# Patient Record
Sex: Female | Born: 1945 | Race: Black or African American | Hispanic: No | Smoking: Never smoker
Health system: Southern US, Community
[De-identification: ages and names within clinical notes are randomized; demographics above are authoritative.]

## PROBLEM LIST (undated history)

## (undated) DIAGNOSIS — I10 Essential (primary) hypertension: Secondary | ICD-10-CM

## (undated) DIAGNOSIS — C50919 Malignant neoplasm of unspecified site of unspecified female breast: Secondary | ICD-10-CM

## (undated) DIAGNOSIS — E7800 Pure hypercholesterolemia, unspecified: Secondary | ICD-10-CM

## (undated) HISTORY — DX: Malignant neoplasm of unspecified site of unspecified female breast: C50.919

---

## 2018-09-27 DIAGNOSIS — R82998 Other abnormal findings in urine: Secondary | ICD-10-CM | POA: Diagnosis not present

## 2018-09-27 DIAGNOSIS — E7849 Other hyperlipidemia: Secondary | ICD-10-CM | POA: Diagnosis not present

## 2018-09-27 DIAGNOSIS — I10 Essential (primary) hypertension: Secondary | ICD-10-CM | POA: Diagnosis not present

## 2018-10-01 DIAGNOSIS — Z8673 Personal history of transient ischemic attack (TIA), and cerebral infarction without residual deficits: Secondary | ICD-10-CM | POA: Diagnosis not present

## 2018-10-01 DIAGNOSIS — R7309 Other abnormal glucose: Secondary | ICD-10-CM | POA: Diagnosis not present

## 2018-10-01 DIAGNOSIS — Z0000 Encounter for general adult medical examination without abnormal findings: Secondary | ICD-10-CM | POA: Diagnosis not present

## 2018-10-01 DIAGNOSIS — Z6826 Body mass index (BMI) 26.0-26.9, adult: Secondary | ICD-10-CM | POA: Diagnosis not present

## 2018-10-01 DIAGNOSIS — I10 Essential (primary) hypertension: Secondary | ICD-10-CM | POA: Diagnosis not present

## 2018-10-01 DIAGNOSIS — Z86010 Personal history of colonic polyps: Secondary | ICD-10-CM | POA: Diagnosis not present

## 2018-10-01 DIAGNOSIS — Z1331 Encounter for screening for depression: Secondary | ICD-10-CM | POA: Diagnosis not present

## 2018-10-01 DIAGNOSIS — E7849 Other hyperlipidemia: Secondary | ICD-10-CM | POA: Diagnosis not present

## 2018-10-06 DIAGNOSIS — Z1212 Encounter for screening for malignant neoplasm of rectum: Secondary | ICD-10-CM | POA: Diagnosis not present

## 2019-04-27 DIAGNOSIS — E7849 Other hyperlipidemia: Secondary | ICD-10-CM | POA: Diagnosis not present

## 2019-04-27 DIAGNOSIS — I10 Essential (primary) hypertension: Secondary | ICD-10-CM | POA: Diagnosis not present

## 2019-05-12 DIAGNOSIS — E7849 Other hyperlipidemia: Secondary | ICD-10-CM | POA: Diagnosis not present

## 2019-06-10 DIAGNOSIS — I10 Essential (primary) hypertension: Secondary | ICD-10-CM | POA: Diagnosis not present

## 2019-10-13 ENCOUNTER — Other Ambulatory Visit: Payer: Self-pay | Admitting: Internal Medicine

## 2019-10-13 DIAGNOSIS — Z1231 Encounter for screening mammogram for malignant neoplasm of breast: Secondary | ICD-10-CM

## 2020-10-11 DIAGNOSIS — R7309 Other abnormal glucose: Secondary | ICD-10-CM | POA: Diagnosis not present

## 2020-10-11 DIAGNOSIS — I10 Essential (primary) hypertension: Secondary | ICD-10-CM | POA: Diagnosis not present

## 2020-10-11 DIAGNOSIS — E785 Hyperlipidemia, unspecified: Secondary | ICD-10-CM | POA: Diagnosis not present

## 2020-10-15 ENCOUNTER — Other Ambulatory Visit: Payer: Self-pay | Admitting: Internal Medicine

## 2020-10-15 DIAGNOSIS — Z1331 Encounter for screening for depression: Secondary | ICD-10-CM | POA: Diagnosis not present

## 2020-10-15 DIAGNOSIS — R7303 Prediabetes: Secondary | ICD-10-CM | POA: Diagnosis not present

## 2020-10-15 DIAGNOSIS — I10 Essential (primary) hypertension: Secondary | ICD-10-CM | POA: Diagnosis not present

## 2020-10-15 DIAGNOSIS — E785 Hyperlipidemia, unspecified: Secondary | ICD-10-CM | POA: Diagnosis not present

## 2020-10-15 DIAGNOSIS — R82998 Other abnormal findings in urine: Secondary | ICD-10-CM | POA: Diagnosis not present

## 2020-10-15 DIAGNOSIS — Z1339 Encounter for screening examination for other mental health and behavioral disorders: Secondary | ICD-10-CM | POA: Diagnosis not present

## 2020-10-15 DIAGNOSIS — Z1231 Encounter for screening mammogram for malignant neoplasm of breast: Secondary | ICD-10-CM

## 2020-10-15 DIAGNOSIS — Z0000 Encounter for general adult medical examination without abnormal findings: Secondary | ICD-10-CM | POA: Diagnosis not present

## 2020-10-31 DIAGNOSIS — Z833 Family history of diabetes mellitus: Secondary | ICD-10-CM | POA: Diagnosis not present

## 2020-10-31 DIAGNOSIS — I10 Essential (primary) hypertension: Secondary | ICD-10-CM | POA: Diagnosis not present

## 2020-10-31 DIAGNOSIS — Z8249 Family history of ischemic heart disease and other diseases of the circulatory system: Secondary | ICD-10-CM | POA: Diagnosis not present

## 2020-10-31 DIAGNOSIS — E785 Hyperlipidemia, unspecified: Secondary | ICD-10-CM | POA: Diagnosis not present

## 2020-12-06 ENCOUNTER — Ambulatory Visit
Admission: RE | Admit: 2020-12-06 | Discharge: 2020-12-06 | Disposition: A | Discharge status: Home / Self Care | Payer: Medicare Other | Source: Ambulatory Visit | Attending: Internal Medicine | Admitting: Internal Medicine

## 2020-12-06 ENCOUNTER — Other Ambulatory Visit: Payer: Self-pay

## 2020-12-06 DIAGNOSIS — Z1231 Encounter for screening mammogram for malignant neoplasm of breast: Secondary | ICD-10-CM

## 2020-12-07 ENCOUNTER — Other Ambulatory Visit: Payer: Self-pay | Admitting: Internal Medicine

## 2020-12-07 DIAGNOSIS — R928 Other abnormal and inconclusive findings on diagnostic imaging of breast: Secondary | ICD-10-CM

## 2020-12-27 ENCOUNTER — Other Ambulatory Visit: Payer: Self-pay

## 2020-12-27 ENCOUNTER — Ambulatory Visit
Admission: RE | Admit: 2020-12-27 | Discharge: 2020-12-27 | Disposition: A | Discharge status: Home / Self Care | Payer: Medicare Other | Source: Ambulatory Visit | Attending: Internal Medicine | Admitting: Internal Medicine

## 2020-12-27 ENCOUNTER — Other Ambulatory Visit: Payer: Self-pay | Admitting: Internal Medicine

## 2020-12-27 DIAGNOSIS — R928 Other abnormal and inconclusive findings on diagnostic imaging of breast: Secondary | ICD-10-CM

## 2021-01-16 ENCOUNTER — Ambulatory Visit
Admission: RE | Admit: 2021-01-16 | Discharge: 2021-01-16 | Disposition: A | Discharge status: Home / Self Care | Payer: Medicare Other | Source: Ambulatory Visit | Attending: Internal Medicine | Admitting: Internal Medicine

## 2021-01-16 ENCOUNTER — Other Ambulatory Visit: Payer: Self-pay

## 2021-01-16 ENCOUNTER — Other Ambulatory Visit: Payer: Self-pay | Admitting: Internal Medicine

## 2021-01-16 DIAGNOSIS — R928 Other abnormal and inconclusive findings on diagnostic imaging of breast: Secondary | ICD-10-CM

## 2021-01-17 ENCOUNTER — Encounter: Payer: Self-pay | Admitting: *Deleted

## 2021-01-17 DIAGNOSIS — D0511 Intraductal carcinoma in situ of right breast: Secondary | ICD-10-CM | POA: Insufficient documentation

## 2021-01-18 ENCOUNTER — Telehealth: Payer: Self-pay | Admitting: Hematology and Oncology

## 2021-01-18 NOTE — Telephone Encounter (Signed)
Spoke to patients daughter to confirm morning clinic appointment for 6/22, packet emailed

## 2021-01-22 NOTE — Progress Notes (Signed)
[facility] NOTE  Patient Care Team: Velna Hatchet, MD as PCP - General (Internal Medicine) Rockwell Germany, RN as Oncology Nurse Navigator Mauro Kaufmann, RN as Oncology Nurse Navigator Donnie Mesa, MD as Consulting Physician (General Surgery) Nicholas Lose, MD as Consulting Physician (Hematology and Oncology) Kyung Rudd, MD as Consulting Physician (Radiation Oncology)  CHIEF COMPLAINTS/PURPOSE OF CONSULTATION:  Newly diagnosed DCIS of right breast  HISTORY OF PRESENTING ILLNESS:  Victoria Huang 75 y.o. female is here because of recent diagnosis of DCIS of the right breast. Screening mammogram on 12/06/20 showed possible asymmetry and calcifications in the right breast. Diagnostic mammogram and [place] on 12/27/20 showed suspicious calcifications and associated parenchymal density in the medial aspect of the right breast and indeterminate hypoechoic mass with internal calcifications in the 4 o'clock location of the right breast 8 centimeters from the nipple. Biopsy of right breast on 01/16/21 showed DCIS, high-grade with calcifications and necrosis at 4 o'clock and DCIS, intermediate to high-grade with calcifications and necrosis in lower inner anterior, ER/PR+ (100%). She presents to the clinic today for initial evaluation and discussion of treatment options.   I reviewed her records extensively and collaborated the history with the patient.  SUMMARY OF ONCOLOGIC HISTORY: Oncology History  Ductal carcinoma in situ (DCIS) of right breast  01/17/2021 Initial Diagnosis   Screening mammogram detected right breast asymmetry and calcifications.  2 areas were detected 4 o'clock position 2.5 cm calcifications, LIQ: 9.1 cm, classifications.  Ultrasound-guided biopsy revealed high-grade DCIS with calcifications and necrosis ER 100%, PR 100%     MEDICAL HISTORY:  Past Medical History:  Diagnosis Date   Breast cancer ([place])     SURGICAL HISTORY: History reviewed.  No pertinent surgical history.  SOCIAL HISTORY: Social History   Socioeconomic History   Marital status: Unknown    Spouse name: Not on file   Number of children: Not on file   Years of education: Not on file   Highest education level: Not on file  Occupational History   Not on file  Tobacco Use   Smoking status: Never   Smokeless tobacco: Never  Substance and Sexual Activity   Alcohol use: Yes    Comment: social   Drug use: Not on file   Sexual activity: Not on file  Other Topics Concern   Not on file  Social History Narrative   Not on file   Social Determinants of Health   Financial Resource Strain: Not on file  Food Insecurity: Not on file  Transportation Needs: Not on file  Physical Activity: Not on file  Stress: Not on file  Social Connections: Not on file  Intimate Partner Violence: Not on file    FAMILY HISTORY: Family History  Problem Relation Age of Onset   Lung cancer Father    Breast cancer Sister 1   Breast cancer Daughter 21    ALLERGIES:  has No Known Allergies.  MEDICATIONS:  Current Outpatient Medications  Medication Sig Dispense Refill   amLODipine (NORVASC) 5 MG tablet Take 1 tablet by mouth at bedtime.     rosuvastatin (CRESTOR) 20 MG tablet Take 20 mg by mouth at bedtime.     No current facility-administered medications for this visit.    REVIEW OF SYSTEMS:   Constitutional: Denies fevers, chills or abnormal night sweats Eyes: Denies blurriness of vision, double vision or watery eyes Ears, nose, mouth, throat, and face: Denies mucositis or sore throat Respiratory: Denies cough, dyspnea or wheezes Cardiovascular:  Denies palpitation, chest discomfort or lower extremity swelling Gastrointestinal:  Denies nausea, heartburn or change in bowel habits Skin: Denies abnormal skin rashes Lymphatics: Denies new lymphadenopathy or easy bruising Neurological:Denies numbness, tingling or new weaknesses Behavioral/Psych: Mood is stable, no new  changes    All other systems were reviewed with the patient and are negative.  PHYSICAL EXAMINATION: ECOG PERFORMANCE STATUS: 1 - Symptomatic but completely ambulatory  Vitals:   01/23/21 0853  BP: (!) 170/78  Pulse: 72  Resp: 19  Temp: 97.8 F (36.6 C)  SpO2: 100%   Filed Weights   01/23/21 0853  Weight: 155 lb 8 oz (70.5 kg)     LABORATORY DATA:  I have reviewed the data as listed Lab Results  Component Value Date   WBC 5.7 01/23/2021   HGB 12.1 01/23/2021   HCT 37.5 01/23/2021   MCV 80.8 01/23/2021   PLT 277 01/23/2021   Lab Results  Component Value Date   NA 140 01/23/2021   K 3.9 01/23/2021   CL 109 01/23/2021   CO2 22 01/23/2021    RADIOGRAPHIC STUDIES: I have personally reviewed the radiological reports and agreed with the findings in the report.  ASSESSMENT AND PLAN:  Ductal carcinoma in situ (DCIS) of right breast Screening mammogram detected right breast asymmetry and calcifications.  2 areas were detected 4 o'clock position 2.5 cm calcifications, LIQ: 9.1 cm, classifications.  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 It is considered a precancerous lesion. DCIS is classified as a 0. It is generally detected through mammograms as calcifications. We discussed the significance of grades and its impact on prognosis. We also discussed the importance of ER and PR receptors and their implications to adjuvant treatment options. Prognosis of DCIS dependence on grade, comedo necrosis. It is anticipated that if not treated, 20-30% of DCIS can develop into invasive breast cancer.  Recommendation: 1.  Mastectomy 2. Followed by antiestrogen therapy with tamoxifen 5 years If she does bilateral mastectomies then she will not need antiestrogen therapy. Genetics consultation will be obtained  Tamoxifen  counseling: We discussed the risks and benefits of tamoxifen. These include but not limited to insomnia, hot flashes, mood changes, vaginal dryness, and weight gain. Although rare, serious side effects including endometrial cancer, risk of blood clots were also discussed. We strongly believe that the benefits far outweigh the risks. Patient understands these risks and consented to starting treatment. Planned treatment duration is 5 years.  She currently works part-time as a Social worker. Her daughter is a 5-year breast cancer survivor and appears to be helping her make these decisions. Return to clinic after surgery to discuss the final pathology report and come up with an adjuvant treatment plan.    All questions were answered. The patient knows to call the clinic with any problems, questions or concerns.   Rulon Eisenmenger, MD, MPH 01/23/2021    I, Thana Ates, am acting as scribe for Nicholas Lose, MD.  I have reviewed the above documentation for accuracy and completeness, and I agree with the above.

## 2021-01-23 ENCOUNTER — Ambulatory Visit (HOSPITAL_BASED_OUTPATIENT_CLINIC_OR_DEPARTMENT_OTHER): Payer: Medicare Other | Admitting: Genetic Counselor

## 2021-01-23 ENCOUNTER — Ambulatory Visit: Payer: Self-pay | Admitting: Surgery

## 2021-01-23 ENCOUNTER — Inpatient Hospital Stay: Payer: Medicare Other

## 2021-01-23 ENCOUNTER — Ambulatory Visit: Payer: Medicare Other | Attending: Surgery | Admitting: Physical Therapy

## 2021-01-23 ENCOUNTER — Encounter: Payer: Self-pay | Admitting: Physical Therapy

## 2021-01-23 ENCOUNTER — Encounter: Payer: Self-pay | Admitting: Genetic Counselor

## 2021-01-23 ENCOUNTER — Ambulatory Visit
Admission: RE | Admit: 2021-01-23 | Discharge: 2021-01-23 | Disposition: A | Discharge status: Home / Self Care | Payer: Medicare Other | Source: Ambulatory Visit | Attending: Radiation Oncology | Admitting: Radiation Oncology

## 2021-01-23 ENCOUNTER — Other Ambulatory Visit: Payer: Self-pay

## 2021-01-23 ENCOUNTER — Encounter: Payer: Self-pay | Admitting: General Practice

## 2021-01-23 ENCOUNTER — Inpatient Hospital Stay: Payer: Medicare Other | Attending: Hematology and Oncology | Admitting: Hematology and Oncology

## 2021-01-23 ENCOUNTER — Encounter: Payer: Self-pay | Admitting: Hematology and Oncology

## 2021-01-23 DIAGNOSIS — Z801 Family history of malignant neoplasm of trachea, bronchus and lung: Secondary | ICD-10-CM | POA: Diagnosis not present

## 2021-01-23 DIAGNOSIS — D0511 Intraductal carcinoma in situ of right breast: Secondary | ICD-10-CM | POA: Diagnosis present

## 2021-01-23 DIAGNOSIS — Z803 Family history of malignant neoplasm of breast: Secondary | ICD-10-CM | POA: Diagnosis not present

## 2021-01-23 DIAGNOSIS — Z170 Estrogen receptor positive status [ER+]: Secondary | ICD-10-CM | POA: Insufficient documentation

## 2021-01-23 DIAGNOSIS — R293 Abnormal posture: Secondary | ICD-10-CM | POA: Insufficient documentation

## 2021-01-23 DIAGNOSIS — C50311 Malignant neoplasm of lower-inner quadrant of right female breast: Secondary | ICD-10-CM | POA: Diagnosis present

## 2021-01-23 DIAGNOSIS — Z8042 Family history of malignant neoplasm of prostate: Secondary | ICD-10-CM

## 2021-01-23 HISTORY — DX: Family history of malignant neoplasm of breast: Z80.3

## 2021-01-23 HISTORY — DX: Family history of malignant neoplasm of prostate: Z80.42

## 2021-01-23 LAB — CMP (CANCER CENTER ONLY)
ALT: 12 U/L (ref 0–44)
AST: 17 U/L (ref 15–41)
Albumin: 3.7 g/dL (ref 3.5–5.0)
Alkaline Phosphatase: 79 U/L (ref 38–126)
Anion gap: 9 (ref 5–15)
BUN: 12 mg/dL (ref 8–23)
CO2: 22 mmol/L (ref 22–32)
Calcium: 9.5 mg/dL (ref 8.9–10.3)
Chloride: 109 mmol/L (ref 98–111)
Creatinine: 0.72 mg/dL (ref 0.44–1.00)
GFR, Estimated: >60 mL/min (ref >60)
Glucose, Bld: 138 mg/dL — ABNORMAL HIGH (ref 70–99)
Potassium: 3.9 mmol/L (ref 3.5–5.1)
Sodium: 140 mmol/L (ref 135–145)
Total Bilirubin: 0.4 mg/dL (ref 0.3–1.2)
Total Protein: 7.1 g/dL (ref 6.5–8.1)

## 2021-01-23 LAB — CBC WITH DIFFERENTIAL (CANCER CENTER ONLY)
Abs Immature Granulocytes: 0.02 10*3/uL (ref 0.00–0.07)
Basophils Absolute: 0.1 10*3/uL (ref 0.0–0.1)
Basophils Relative: 1 %
Eosinophils Absolute: 0.1 10*3/uL (ref 0.0–0.5)
Eosinophils Relative: 2 %
HCT: 37.5 % (ref 36.0–46.0)
Hemoglobin: 12.1 g/dL (ref 12.0–15.0)
Immature Granulocytes: 0 %
Lymphocytes Relative: 33 %
Lymphs Abs: 1.9 10*3/uL (ref 0.7–4.0)
MCH: 26.1 pg (ref 26.0–34.0)
MCHC: 32.3 g/dL (ref 30.0–36.0)
MCV: 80.8 fL (ref 80.0–100.0)
Monocytes Absolute: 0.4 10*3/uL (ref 0.1–1.0)
Monocytes Relative: 8 %
Neutro Abs: 3.2 10*3/uL (ref 1.7–7.7)
Neutrophils Relative %: 56 %
Platelet Count: 277 10*3/uL (ref 150–400)
RBC: 4.64 MIL/uL (ref 3.87–5.11)
RDW: 14.6 % (ref 11.5–15.5)
WBC Count: 5.7 10*3/uL (ref 4.0–10.5)
nRBC: 0 % (ref 0.0–0.2)

## 2021-01-23 LAB — GENETIC SCREENING ORDER

## 2021-01-23 NOTE — Therapy (Signed)
[place], [place], [address] Phone: [phone]   Fax:  [phone]  Physical Therapy Evaluation  Patient Details  Name: Victoria Huang MRN: [id number] Date of Birth: [birth year] Referring Provider (PT): Dr. Donnie Mesa   Encounter Date: 01/23/2021   PT End of Session - 01/23/21 1246     Visit Number 1    Number of Visits 2    Date for PT Re-Evaluation 03/20/21    PT Start Time 0903    PT Stop Time 0940    PT Time Calculation (min) 37 min    Activity Tolerance Patient tolerated treatment well    Behavior During Therapy [facility] for tasks assessed/performed             Past Medical History:  Diagnosis Date   Breast cancer [facility])    Family history of breast cancer 01/23/2021   Family history of prostate cancer 01/23/2021    History reviewed. No pertinent surgical history.  There were no vitals filed for this visit.    Subjective Assessment - 01/23/21 1235     Subjective Patient reports she is here today to be seen by her medical team for her newly diagnosed right breast cancer.    Patient is accompained by: Family member    Pertinent History Patient was diagnosed on 12/06/2020 with right high grade DCIS breast cancer. There are 2 areas of calcifications measuring 9.1 cm and 2.5 cm located in the lower inner quadrant. It is ER/PR positive.    Patient Stated Goals Reduce lymphedema risk and learn post op shoulder ROM HEP    Currently in Pain? No/denies                [facility] PT Assessment - 01/23/21 0001       Assessment   Medical Diagnosis Right breast cancer    Referring Provider (PT) Dr. Donnie Mesa    Onset Date/Surgical Date 12/06/20    Hand Dominance Right    Prior Therapy none      Precautions   Precautions Other (comment)    Precaution Comments active cancer      Restrictions   Weight Bearing Restrictions No      Balance Screen   Has the patient fallen in the past 6  months No    Has the patient had a decrease in activity level because of a fear of falling?  No    Is the patient reluctant to leave their home because of a fear of falling?  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    Leisure She swims in the summer      Cognition   Overall Cognitive Status Within Functional Limits for tasks assessed      Posture/Postural Control   Posture/Postural Control Postural limitations    Postural Limitations Rounded Shoulders;Forward head      ROM / Strength   AROM / PROM / Strength AROM;Strength      AROM   Overall AROM Comments Cervical extension limited 25%; others are WNL    AROM Assessment Site Shoulder    Right/Left Shoulder Right;Left    Right Shoulder Extension 45 Degrees    Right Shoulder Flexion 144  Degrees    Right Shoulder ABduction 150 Degrees    Right Shoulder Internal Rotation 69 Degrees    Right Shoulder External Rotation 68 Degrees    Left Shoulder Extension 52 Degrees    Left Shoulder Flexion 136 Degrees    Left Shoulder ABduction 134 Degrees    Left Shoulder Internal Rotation 63 Degrees    Left Shoulder External Rotation 66 Degrees      Strength   Overall Strength Within functional limits for tasks performed               LYMPHEDEMA/ONCOLOGY QUESTIONNAIRE - 01/23/21 0001       Type   Cancer Type Right breast cancer      Lymphedema Assessments   Lymphedema Assessments Upper extremities      Right Upper Extremity Lymphedema   10 cm Proximal to Olecranon Process 29.4 cm    Olecranon Process 25.5 cm    10 cm Proximal to Ulnar Styloid Process 22.5 cm    Just Proximal to Ulnar Styloid Process 16.1 cm    Across Hand at [employer] 20 cm    At [place] of 2nd Digit 6.6 cm      Left Upper  Extremity Lymphedema   10 cm Proximal to Olecranon Process 29.4 cm    Olecranon Process 25.9 cm    10 cm Proximal to Ulnar Styloid Process 21.6 cm    Just Proximal to Ulnar Styloid Process 16.1 cm    Across Hand at [employer] 20.2 cm    At [place] of 2nd Digit 6.4 cm             L-DEX FLOWSHEETS - 01/23/21 1200       L-DEX LYMPHEDEMA SCREENING   Measurement Type Unilateral    L-DEX MEASUREMENT EXTREMITY Upper Extremity    POSITION  Standing    DOMINANT SIDE Right    At Risk Side Right    BASELINE SCORE (UNILATERAL) -2             The patient was assessed using the L-Dex machine today to produce a lymphedema index baseline score. The patient will be reassessed on a regular basis (typically every 3 months) to obtain new L-Dex scores. If the score is > 6.5 points away from his/her baseline score indicating onset of subclinical lymphedema, it will be recommended to wear a compression garment for 4 weeks, 12 hours per day and then be reassessed. If the score continues to be > 6.5 points from baseline at reassessment, we will initiate lymphedema treatment. Assessing in this manner has a 95% rate of preventing clinically significant lymphedema.      Katina Dung - 01/23/21 0001     Open a tight or new jar No difficulty    Do heavy household chores (wash walls, wash floors) No difficulty    Carry a shopping bag or briefcase No difficulty    Wash your back No difficulty    Use a knife to cut food No difficulty    Recreational activities in which you take some force or impact through your arm, shoulder, or hand (golf, hammering, tennis) Moderate difficulty    During the past week, to what extent has your arm, shoulder or hand problem interfered with your normal social activities with family, friends, neighbors, or groups? Slightly    During the past week, to what extent has your arm, shoulder or hand problem limited your work or other regular daily activities Extremely    Arm,  shoulder, or hand pain. Moderate    Tingling (pins and needles) in your arm, shoulder, or hand Moderate    Difficulty Sleeping Mild difficulty    DASH Score 27.27 %              Objective measurements completed on examination: See above findings.         Patient was instructed today in a home exercise program today for post op shoulder range of motion. These included active assist shoulder flexion in sitting, scapular retraction, wall walking with shoulder abduction, and hands behind head external rotation.  She was encouraged to do these twice a day, holding 3 seconds and repeating 5 times when permitted by her physician.        PT Education - 01/23/21 1245     Education Details Lymphedema risk reduction and post op shoulder ROM HEP    Person(s) Educated Patient;Child(ren)    Methods Explanation;Demonstration;Handout    Comprehension Verbalized understanding;Returned demonstration                 PT Long Term Goals - 01/23/21 1249       PT LONG TERM GOAL #1   Title Patient will demonstrate she has regained full shoulder ROM and function post operatively compared to baselines.    Time 8    Period Weeks    Status New    Target Date 03/20/21             Breast Clinic Goals - 01/23/21 1249       Patient will be able to verbalize understanding of pertinent lymphedema risk reduction practices relevant to her diagnosis specifically related to skin care.   Time 1    Period Days    Status Achieved      Patient will be able to return demonstrate and/or verbalize understanding of the post-op home exercise program related to regaining shoulder range of motion.   Time 1    Period Days    Status Achieved      Patient will be able to verbalize understanding of the importance of attending the postoperative After Breast Cancer Class for further lymphedema risk reduction education and therapeutic exercise.   Time 1    Period Days    Status Achieved                    Plan - 01/23/21 1247     Clinical Impression Statement Patient was diagnosed on 12/06/2020 with right high grade DCIS breast cancer. There are 2 areas of calcifications measuring 9.1 cm and 2.5 cm located in the lower inner quadrant. It is ER/PR positive. Her multidisciplinary medical team met prior to her assessments to determine a recommended treatment plan. She is planning to have a right mastectomy and sentinel node biopsy followed by anti-estrogen therapy. She will benefit from a post op PT reassessment to determine needs and from L-Dex screens every 3 months for 2 years to detect subclinical lymphedema.    Stability/Clinical Decision Making Stable/Uncomplicated    Clinical Decision Making Low    Rehab Potential Excellent    PT Frequency --   Eval and 1 f/u visit   PT Treatment/Interventions ADLs/Self Care Home Management;Therapeutic exercise;Patient/family education    PT Next Visit Plan Will reassess 3-4 weeks post op    PT Home Exercise Plan Post op shoulder ROM HEP    Consulted and Agree with Plan of Care Patient;Family member/caregiver    Family Member Consulted Daughter  Patient will benefit from skilled therapeutic intervention in order to improve the following deficits and impairments:  Decreased knowledge of precautions, Postural dysfunction, Decreased range of motion, Impaired UE functional use, Pain  Visit Diagnosis: Malignant neoplasm of lower-inner quadrant of right 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, estrogen receptor positive ([place]) - Plan: PT plan of care cert/re-cert  Abnormal posture - Plan: PT plan of care cert/re-cert  Patient will follow up at outpatient cancer rehab 3-4 weeks following surgery.  If the patient requires physical therapy at that time, a specific plan will be dictated and sent to the referring physician for approval. The patient was educated today on appropriate basic range of motion exercises to begin post operatively and the importance  of attending the After Breast Cancer class following surgery.  Patient was educated today on lymphedema risk reduction practices as it pertains to recommendations that will benefit the patient immediately following surgery.  She verbalized good understanding.      Problem List Patient Active Problem List   Diagnosis Date Noted   Family history of breast cancer 01/23/2021   Family history of prostate cancer 01/23/2021   Ductal carcinoma in situ (DCIS) of right breast 01/17/2021   Annia Friendly, PT 01/23/21 1:56 PM   [place], [place], [address] Phone: [phone]   Fax:  [phone]  Name: ANNALEIA PENCE MRN: [id number] Date of Birth: 03-Jul-1946

## 2021-01-23 NOTE — H&P (View-Only) (Signed)
History of Present Illness Victoria Huang. Victoria Tallarico MD; 01/23/2021 12:19 PM) The patient is a 75 year old female who presents with breast cancer. [place] 01/23/21 Victoria Huang/ Victoria Huang  This is a 75 year old female who presents after a recent screening mammogram revealed right breast asymmetry and calcifications.  She was found to have right lower inner quadrant calcifications spanning 9.1 x 2.9 x 1.9 centimeters.  She has at least three other areas of concern, the largest in the right lower inner quadrant measuring 2.5 x 0.8 x 0.4 cm.  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  ER/PR positive.    She is accompanied by her daughter, who had breast cancer in her early 75's and underwent bilateral mastectomies with DIEP flap reconstruction.    She presents today for treatment discussion.  CLINICAL DATA:  Patient returns after new baseline study for evaluation of possible RIGHT breast asymmetry and RIGHT breast calcifications.  EXAM: DIGITAL DIAGNOSTIC UNILATERAL RIGHT MAMMOGRAM WITH TOMOSYNTHESIS AND CAD; ULTRASOUND RIGHT BREAST LIMITED  TECHNIQUE: Right digital diagnostic mammography and breast tomosynthesis was performed. The images were evaluated with computer-aided detection.; Targeted ultrasound examination of the right breast was performed  COMPARISON:  12/06/2020  ACR Breast Density Category b: There are scattered areas of fibroglandular density.  FINDINGS: Spot compression views are performed of the LATERAL portion of the RIGHT breast, confirming presence of asymmetry/possible mass in the UPPER-OUTER QUADRANT.  Magnification views are performed of calcifications in the LOWER INNER QUADRANT of the RIGHT breast. These views demonstrate extensive faint linear calcifications spanning 9.1 x 2.9 x 1.9 centimeters. Along the most MEDIAL aspect of the calcifications, there is associated parenchymal density.  A small mass associated with calcifications is  identified in the LOWER portion of the RIGHT breast.  On physical exam, I palpate no abnormality in the MEDIAL aspect of the RIGHT breast.  Targeted ultrasound is performed, showing numerous hypoechoic parallel oval masses in the UPPER-OUTER QUADRANT of the RIGHT breast, largest measuring 0.9 centimeters. No solid mass or areas of acoustic shadowing identified in the LATERAL aspect of the RIGHT breast.  In the 4 o'clock location of the RIGHT breast 8 centimeters from the nipple, there is an oval circumscribed hypoechoic mass containing calcifications and associated posterior acoustic shadowing. This mass measures 0.5 x 0.3 x 0.6 centimeters.  In the 4 o'clock location of the RIGHT breast 10 centimeters from the nipple, there is tubular shaped hypoechoic tissue containing calcifications and spanning 2.5 x 0.8 x 0.4 centimeters. These calcifications are best appreciated at real-time imaging.  Evaluation of the RIGHT axilla is negative for adenopathy.  IMPRESSION: 1. Suspicious calcifications and associated parenchymal density in the MEDIAL aspect of the RIGHT breast warranting tissue diagnosis. 2. No suspicious abnormality in the UPPER-OUTER QUADRANT of the RIGHT breast. 3. Indeterminate hypoechoic mass with internal calcifications in the 4 o'clock location of the RIGHT breast 8 centimeters from the nipple.  RECOMMENDATION: 1. Recommend ultrasound-guided core biopsy of the more MEDIAL component of the calcifications in the 4 o'clock location 10 centimeters from the nipple. 2. Recommend stereotactic guided core biopsy of the more anterior calcifications in the MEDIAL aspect of the RIGHT breast. 3. Pending review of pathology results and clip placement, biopsy of the mass the 4 o'clock location 8 centimeters from the nipple may not be necessary. As needed, biopsy of this lesion can be performed.  I have discussed the findings and recommendations with the patient. If  applicable, a reminder letter will  be sent to the patient regarding the next appointment.  BI-RADS CATEGORY  5: Highly suggestive of malignancy.   Electronically Signed  By: Nolon Nations M.D.  On: 12/27/2020 14:31  CLINICAL DATA:  75 year old female presenting for ultrasound-guided biopsy of a mass with calcifications in the medial right breast  EXAM: ULTRASOUND GUIDED RIGHT BREAST CORE NEEDLE BIOPSY  COMPARISON:  Previous exam(s).  PROCEDURE: I met with the patient and we discussed the procedure of ultrasound-guided biopsy, including benefits and alternatives. We discussed the high likelihood of a successful procedure. We discussed the risks of the procedure, including infection, bleeding, tissue injury, clip migration, and inadequate sampling. Informed written consent was given. The usual time-out protocol was performed immediately prior to the procedure.  Lesion quadrant: Lower inner quadrant  Using sterile technique and 1% Lidocaine as local anesthetic, under direct ultrasound visualization, a 14 gauge spring-loaded device was used to perform biopsy of a mass with calcifications in the right breast at 4 o'clock, 10 cm from the nipple using an inferomedial approach. At the conclusion of the procedure a ribbon shaped tissue marker clip was deployed into the biopsy cavity. Follow up 2 view mammogram was performed and dictated separately.  IMPRESSION: Ultrasound guided biopsy of a mass with calcifications in the right breast at 4 o'clock, 10 cm from the nipple. No apparent complications.  Electronically Signed: By: Ammie Ferrier M.D. On: 01/16/2021 08:20  CLINICAL DATA:  75 year old female presenting for stereotactic biopsy of calcifications anterior breast  EXAM: RIGHT BREAST STEREOTACTIC CORE NEEDLE BIOPSY  COMPARISON:  Previous exams.  FINDINGS: The patient and I discussed the procedure of stereotactic-guided biopsy including benefits and  alternatives. We discussed the high likelihood of a successful procedure. We discussed the risks of the procedure including infection, bleeding, tissue injury, clip migration, and inadequate sampling. Informed written consent was given. The usual time out protocol was performed immediately prior to the procedure.  Using sterile technique and 1% Lidocaine as local anesthetic, under stereotactic guidance, a 9 gauge vacuum assisted device was used to perform core needle biopsy of calcifications in the lower-inner quadrant of the right breast using a medial approach. Specimen radiograph was performed showing calcifications in multiple core samples. Specimens with calcifications are identified for pathology.  Lesion quadrant: Lower inner quadrant  At the conclusion of the procedure, and X shaped tissue marker clip was deployed into the biopsy cavity. Follow-up 2-view mammogram was performed and dictated separately.  IMPRESSION: Stereotactic-guided biopsy of calcifications in the lower inner anterior right breast. No apparent complications.  Electronically Signed: By: Ammie Ferrier M.D. On: 01/16/2021 10:23   Past Surgical History Conni Slipper, RN; 01/23/2021 8:01 AM) Breast Biopsy  Right. Oral Surgery   Diagnostic Studies History Conni Slipper, RN; 01/23/2021 8:01 AM) Colonoscopy  1-5 years ago Mammogram  1-3 years ago Pap Smear  1-5 years ago  Allergies Rodman Key K. Malarie Tappen, MD; 01/23/2021 12:19 PM) No Known Allergies  [01/21/2021]:  Medication History Victoria Huang. Amyr Sluder, MD; 01/23/2021 12:19 PM) Medications Reconciled  amLODIPine Besylate  (5MG Tablet, Oral) Active. Aspirin  (81MG Tablet DR, Oral) Active. Rosuvastatin Calcium  (20MG Tablet, Oral) Active.  Social History Conni Slipper, RN; 01/23/2021 8:01 AM) Alcohol use  Occasional alcohol use. Caffeine use  Coffee. No drug use  Tobacco use  Never smoker.  Family History Conni Slipper, RN; 01/23/2021 8:01 AM) Breast Cancer   Sister. Diabetes Mellitus  Sister. Kidney Disease  Sister. Prostate Cancer  Brother.  Pregnancy / Birth History Conni Slipper, RN;  01/23/2021 8:01 AM) Age at menarche  53 years. Age of menopause  <45 Contraceptive History  Oral contraceptives. Maternal age  6-25 Para  2 Regular periods   Other Problems Conni Slipper, RN; 01/23/2021 8:01 AM) High blood pressure      Review of Systems Conni Slipper RN; 01/23/2021 8:01 AM) General Not Present- Appetite Loss, Chills, Fatigue, Fever, Night Sweats, Weight Gain and Weight Loss. Skin Not Present- Change in Wart/Mole, Dryness, Hives, Jaundice, New Lesions, Non-Healing Wounds, Rash and Ulcer. HEENT Not Present- Earache, Hearing Loss, Hoarseness, Nose Bleed, Oral Ulcers, Ringing in the Ears, Seasonal Allergies, Sinus Pain, Sore Throat, Visual Disturbances, Wears glasses/contact lenses and Yellow Eyes. Respiratory Not Present- Bloody sputum, Chronic Cough, Difficulty Breathing, Snoring and Wheezing. Breast Not Present- Breast Mass, Breast Pain, Nipple Discharge and Skin Changes. Cardiovascular Not Present- Chest Pain, Difficulty Breathing Lying Down, Leg Cramps, Palpitations, Rapid Heart Rate, Shortness of Breath and Swelling of Extremities. Gastrointestinal Not Present- Abdominal Pain, Bloating, Bloody Stool, Change in Bowel Habits, Chronic diarrhea, Constipation, Difficulty Swallowing, Excessive gas, Gets full quickly at meals, Hemorrhoids, Indigestion, Nausea, Rectal Pain and Vomiting. Female Genitourinary Not Present- Frequency, Nocturia, Painful Urination, Pelvic Pain and Urgency. Musculoskeletal Not Present- Back Pain, Joint Pain, Joint Stiffness, Muscle Pain, Muscle Weakness and Swelling of Extremities. Neurological Present- Tingling. Not Present- Decreased Memory, Fainting, Headaches, Numbness, Seizures, Tremor, Trouble walking and Weakness. Psychiatric Not Present- Anxiety, Bipolar, Change in Sleep Pattern, Depression, Fearful and Frequent  crying. Endocrine Not Present- Cold Intolerance, Excessive Hunger, Hair Changes, Heat Intolerance, Hot flashes and New Diabetes. Hematology Not Present- Blood Thinners, Easy Bruising, Excessive bleeding, Gland problems, HIV and Persistent Infections.   Physical Exam Rodman Key K. Normalee Sistare MD; 01/23/2021 12:19 PM)  The physical exam findings are as follows: Note: Constitutional: WDWN in NAD, conversant, no obvious deformities; resting comfortably Eyes: Pupils equal, round; sclera anicteric; moist conjunctiva; no lid lag HENT: Oral mucosa moist; good dentition Neck: No masses palpated, trachea midline; no thyromegaly Lungs: CTA bilaterally; normal respiratory effort Breasts: symmetric; slight ecchymosis from biopsy; no palpable masses or lymphadenopathy; no nipple changes CV: Regular rate and rhythm; no murmurs; extremities well-perfused with no edema Abd: +bowel sounds, soft, non-tender, no palpable organomegaly; no palpable hernias Musc: Normal gait; no apparent clubbing or cyanosis in extremities Lymphatic: No palpable cervical or axillary lymphadenopathy Skin: Warm, dry; no sign of jaundice Psychiatric - alert and oriented x 4; calm mood and affect    Assessment & Plan Rodman Key K. Zavannah Deblois MD; 01/23/2021 12:22 PM)  DUCTAL CARCINOMA IN SITU (DCIS) OF RIGHT BREAST (D05.11)  Current Plans Schedule for Surgery - Right mastectomy with sentinel lymph node biopsy.  The surgical procedure has been discussed with the patient.  Potential risks, benefits, alternative treatments, and expected outcomes have been explained.  All of the patient's questions at this time have been answered.  The likelihood of reaching the patient's treatment goal is good.  The patient understand the proposed surgical procedure and wishes to proceed. Note: Due to the large area of DCIS and multifocal disease, we recommend right mastectomy with sentinel lymph node biopsy, followed by anti-estrogens.  We will perform genetic  testing prior to surgery.  There is no indication to pursue MRI and other right breast biopsies since she will have mastectomy.  Depending on genetic testing, she may want to have a left prophylactic mastectomy.    We will go ahead and post her right mastectomy with SLNB, but can adjust to add on a left mastectomy if needed.  She does not want to consider reconstruction.  Victoria Huang. Georgette Dover, MD, [facility]  General/ Trauma Surgery   01/23/2021 12:23 PM

## 2021-01-23 NOTE — Progress Notes (Signed)
[place] Psychosocial Distress Screening Clinical Social Work  Clinical Social Work was referred by distress screening protocol.  The patient scored a 7 on the Psychosocial Distress Thermometer which indicates moderate distress. Clinical Social Worker met with patient in exam room"", she was accompanied by her adult daughter who is a breast cancer survivor and a good support for her  to assess for distress and other psychosocial needs. She is normally active and independent, lives alone after having moved here from [place] approx 2 years ago. She currently works part time, although daughter says she often works more than 40 hours/week.  She wonders if she needs to complete FMLA paperwork as she is part time.  CSW encouraged her to speak w her HR department and determine her next steps. She is understandably anxious about upcoming treatments.  Per daughter, she may have difficulty asking for help and being dependent on others as she is used to being on her own.  Daughter lives nearby and is willing to help as needed.  CSW and patient discussed common feeling and emotions when being diagnosed with cancer, and the importance of support during treatment.  CSW informed patient of the support team and support services at [facility].  CSW provided contact information and encouraged patient to call with any questions or concerns.   ONCBCN DISTRESS SCREENING 01/23/2021  Screening Type Initial Screening  Distress experienced in past week (1-10) 7  Practical problem type Work/school  Emotional problem type Adjusting to illness  Spiritual/Religous concerns type Facing my mortality  Information Concerns Type Lack of info about diagnosis;Lack of info about treatment;Lack of info about complementary therapy choices  Physical Problem type Changes in urination;Tingling hands/feet  Other [phone]    Clinical Social Worker follow up needed: No.  If yes, follow up plan:  Beverely Pace, [place],  LCSW Clinical Social Worker Phone:  [phone]

## 2021-01-23 NOTE — Progress Notes (Signed)
REFERRING PROVIDER: Nicholas Lose, MD [place],  [place] [address]  PRIMARY PROVIDER:  Velna Hatchet, MD  PRIMARY REASON FOR VISIT:  Encounter Diagnoses  Name Primary?   Ductal carcinoma in situ (DCIS) of right breast Yes   Family history of breast cancer    Family history of prostate cancer      HISTORY OF PRESENT ILLNESS:   Victoria Huang, a 75 y.o. female, was seen for a [facility] cancer genetics consultation at the request of Dr. Lindi Adie due to a personal and family history of cancer.  Victoria Huang presents to clinic today to discuss the possibility of a hereditary predisposition to cancer, to discuss genetic testing, and to further clarify her future cancer risks, as well as potential cancer risks for family members.   In June 2022, at the age of 65, Victoria Huang was diagnosed with ductal carcinoma in situ of the right breast (ER+/PR+). The preliminary treatment plan includes right mastectomy and anti-estrogens.   CANCER HISTORY:  Oncology History  Ductal carcinoma in situ (DCIS) of right breast  01/17/2021 Initial Diagnosis   Ductal carcinoma in situ (DCIS) of right breast  Screening mammogram on 12/06/20 showed possible asymmetry and calcifications in the right breast. Diagnostic mammogram and [place] on 12/27/20 showed suspicious calcifications and associated parenchymal density in the medial aspect of the right breast and indeterminate hypoechoic mass with internal calcifications in the 4 o'clock location of the right breast 8 centimeters from the nipple. Biopsy of right breast on 01/16/21 showed DCIS, high-grade with calcifications and necrosis at 4 o'clock and DCIS, intermediate to high-grade with calcifications and necrosis in lower inner anterior, ER/PR+ (100%).      RISK FACTORS:  Menarche was at age 88 or 33.  First live birth at age 26.  OCP use for unknown number of years. Ovaries intact: yes.  Hysterectomy: no.  Menopausal status:  postmenopausal.  HRT use: 0 years. Colonoscopy: yes;  most recent in May 2019 . Mammogram within the last year: yes. Up to date with pelvic exams: yes; most recent PAP in 2019  No past medical history on file.  No past surgical history on file.  Social History   Socioeconomic History   Marital status: Unknown    Spouse name: Not on file   Number of children: Not on file   Years of education: Not on file   Highest education level: Not on file  Occupational History   Not on file  Tobacco Use   Smoking status: Not on file   Smokeless tobacco: Not on file  Substance and Sexual Activity   Alcohol use: Not on file   Drug use: Not on file   Sexual activity: Not on file  Other Topics Concern   Not on file  Social History Narrative   Not on file   Social Determinants of Health   Financial Resource Strain: Not on file  Food Insecurity: Not on file  Transportation Needs: Not on file  Physical Activity: Not on file  Stress: Not on file  Social Connections: Not on file     FAMILY HISTORY:  We obtained a detailed, 4-generation family history.  Significant diagnoses are listed below: Family History  Problem Relation Age of Onset   Lung cancer Father 36       smoking hx   Breast cancer Sister 26   Prostate cancer Brother        dx unknown age   70 Brother  unknown type; dx after 59   Breast cancer Daughter 67    Victoria Huang is unaware of previous family history of genetic testing for hereditary cancer risks. There is no reported Ashkenazi Jewish ancestry. There is no known consanguinity.  GENETIC COUNSELING ASSESSMENT: Victoria Huang is a 75 y.o. female with a personal and family history of cancer which is somewhat suggestive of a hereditary cancer syndrome and predisposition to cancer given the presence of related cancers in the family. We, therefore, discussed and recommended the following at today's visit.   DISCUSSION: We discussed that 5 - 10% of cancer is  hereditary, with most cases of hereditary breast cancer associated with mutations in BRCA1/2.  There are other genes that can be associated with hereditary breast cancer syndromes.  Type of cancer risk and level of risk are gene specific.  We discussed that testing is beneficial for several reasons including identify possible surgical/management options, knowing how to follow individuals after completing their treatment, and understanding if other family members could be at risk for cancer and allowing them to undergo genetic testing.   We reviewed the characteristics, features and inheritance patterns of hereditary cancer syndromes. We also discussed genetic testing, including the appropriate family members to test, the process of testing, insurance coverage and turn-around-time for results. We discussed the implications of a negative, positive and/or variant of uncertain significant result. In order to get genetic test results in a timely manner so that Victoria Huang can use these genetic test results for surgical decisions, we recommended Victoria Huang pursue genetic testing for the [facility]. The BRCAplus panel offered by [employer] and includes sequencing and deletion/duplication analysis for the following 8 genes: ATM, BRCA1, BRCA2, CDH1, CHEK2, PALB2, PTEN, and TP53. Once complete, we recommend Victoria Huang pursue reflex genetic testing to a more comprehensive gene panel.  Victoria Huang  was offered a common hereditary cancer panel (47 genes) and an expanded pan-cancer panel (77 genes). Victoria Huang was informed of the benefits and limitations of each panel, including that expanded pan-cancer panels contain genes that do not have clear management guidelines at this point in time.  We also discussed that as the number of genes included on a panel increases, the chances of variants of uncertain significance increases.  After considering the benefits and limitations of each gene panel, Victoria Huang.  Huang  elected to have a common hereditary cancers panel through [place].  The CustomNext-Cancer+RNAinsight panel offered by [facility] includes sequencing and rearrangement analysis for the following 47 genes:  APC, ATM, AXIN2, BARD1, BMPR1A, BRCA1, BRCA2, BRIP1, CDH1, CDK4, CDKN2A, CHEK2, DICER1, EPCAM, GREM1, HOXB13, MEN1, MLH1, MSH2, MSH3, MSH6, MUTYH, NBN, NF1, NF2, NTHL1, PALB2, PMS2, POLD1, POLE, PTEN, RAD51C, RAD51D, RECQL, RET, SDHA, SDHAF2, SDHB, SDHC, SDHD, SMAD4, SMARCA4, STK11, TP53, TSC1, TSC2, and VHL.  RNA data is routinely analyzed for use in variant interpretation for all genes.  Based on Victoria Huang's personal and family history of cancer, she meets medical criteria for genetic testing. Despite that she meets criteria, she may still have an out of pocket cost. We discussed that if her out of pocket cost for testing is over $100, the laboratory should contact her to discuss self-pay options and/or patient pay assistance programs.    PLAN: After considering the risks, benefits, and limitations, Victoria Huang provided informed consent to pursue genetic testing and the blood sample was sent to [facility] for analysis of the [place] +RNAinsight Panel. Victoria Huang also requested that  her daughter, Trinna Post, be called with results as well. Results should be available within approximately 1-2 weeks' time, at which point they will be disclosed by telephone to Victoria Huang, as will any additional recommendations warranted by these results. Victoria Huang. [place] will receive a summary of her genetic counseling visit and a copy of her results once available. This information will also be available in Epic.    Lastly, we encouraged Victoria Huang. [place] to remain in contact with cancer genetics annually so that we can continuously update the family history and inform her of any changes in cancer genetics and testing that may be of benefit for this family.   Victoria Huang. [place] questions were  answered to her satisfaction today. Our contact information was provided should additional questions or concerns arise. Thank you for the referral and allowing [place] to share in the care of your patient.   Armoni Depass M. Joette Catching, [place], [facility] Genetic Counselor [email or web address] (P) [phone]  The patient was seen for a total of 20 minutes in face-to-face genetic counseling.  The patient brought her daughter, Trinna Post, to her appointment. Drs. Magrinat, Lindi Adie and/or Burr Medico were available to discuss this case as needed.    ___________________________________________________________________ For Office Staff:  Number of people involved in session: 2 Was an Intern/ student involved with case: no

## 2021-01-23 NOTE — Patient Instructions (Signed)

## 2021-01-23 NOTE — Assessment & Plan Note (Signed)
Screening mammogram detected right breast asymmetry and calcifications.  2 areas were detected 4 o'clock position 2.5 cm calcifications, LIQ: 9.1 cm, classifications.  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 It is considered a precancerous lesion. DCIS is classified as a 0. It is generally detected through mammograms as calcifications. We discussed the significance of grades and its impact on prognosis. We also discussed the importance of ER and PR receptors and their implications to adjuvant treatment options. Prognosis of DCIS dependence on grade, comedo necrosis. It is anticipated that if not treated, 20-30% of DCIS can develop into invasive breast cancer.  Recommendation: 1.  Mastectomy 2. Followed by antiestrogen therapy with tamoxifen 5 years If she does bilateral mastectomies then she will not need antiestrogen therapy.  Tamoxifen counseling: We discussed the risks and benefits of tamoxifen. These include but not limited to insomnia, hot flashes, mood changes, vaginal dryness, and weight gain. Although rare, serious side effects including endometrial cancer, risk of blood clots were also discussed. We strongly believe that the benefits far outweigh the risks. Patient understands these risks and consented to starting treatment. Planned treatment duration is 5 years.  Return to clinic after surgery to discuss the final pathology report and come up with an adjuvant treatment plan.

## 2021-01-23 NOTE — H&P (Signed)
History of Present Illness Victoria Huang. Victoria Crass MD; 01/23/2021 12:19 PM) The patient is a 75 year old female who presents with breast cancer. [place] 01/23/21 [place]/ Lisbeth Renshaw  This is a 75 year old female who presents after a recent screening mammogram revealed right breast asymmetry and calcifications.  She was found to have right lower inner quadrant calcifications spanning 9.1 x 2.9 x 1.9 centimeters.  She has at least three other areas of concern, the largest in the right lower inner quadrant measuring 2.5 x 0.8 x 0.4 cm.  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  ER/PR positive.    She is accompanied by her daughter, who had breast cancer in her early 57's and underwent bilateral mastectomies with DIEP flap reconstruction.    She presents today for treatment discussion.  CLINICAL DATA:  Patient returns after new baseline study for evaluation of possible RIGHT breast asymmetry and RIGHT breast calcifications.  EXAM: DIGITAL DIAGNOSTIC UNILATERAL RIGHT MAMMOGRAM WITH TOMOSYNTHESIS AND CAD; ULTRASOUND RIGHT BREAST LIMITED  TECHNIQUE: Right digital diagnostic mammography and breast tomosynthesis was performed. The images were evaluated with computer-aided detection.; Targeted ultrasound examination of the right breast was performed  COMPARISON:  12/06/2020  ACR Breast Density Category b: There are scattered areas of fibroglandular density.  FINDINGS: Spot compression views are performed of the LATERAL portion of the RIGHT breast, confirming presence of asymmetry/possible mass in the UPPER-OUTER QUADRANT.  Magnification views are performed of calcifications in the LOWER INNER QUADRANT of the RIGHT breast. These views demonstrate extensive faint linear calcifications spanning 9.1 x 2.9 x 1.9 centimeters. Along the most MEDIAL aspect of the calcifications, there is associated parenchymal density.  A small mass associated with calcifications is  identified in the LOWER portion of the RIGHT breast.  On physical exam, I palpate no abnormality in the MEDIAL aspect of the RIGHT breast.  Targeted ultrasound is performed, showing numerous hypoechoic parallel oval masses in the UPPER-OUTER QUADRANT of the RIGHT breast, largest measuring 0.9 centimeters. No solid mass or areas of acoustic shadowing identified in the LATERAL aspect of the RIGHT breast.  In the 4 o'clock location of the RIGHT breast 8 centimeters from the nipple, there is an oval circumscribed hypoechoic mass containing calcifications and associated posterior acoustic shadowing. This mass measures 0.5 x 0.3 x 0.6 centimeters.  In the 4 o'clock location of the RIGHT breast 10 centimeters from the nipple, there is tubular shaped hypoechoic tissue containing calcifications and spanning 2.5 x 0.8 x 0.4 centimeters. These calcifications are best appreciated at real-time imaging.  Evaluation of the RIGHT axilla is negative for adenopathy.  IMPRESSION: 1. Suspicious calcifications and associated parenchymal density in the MEDIAL aspect of the RIGHT breast warranting tissue diagnosis. 2. No suspicious abnormality in the UPPER-OUTER QUADRANT of the RIGHT breast. 3. Indeterminate hypoechoic mass with internal calcifications in the 4 o'clock location of the RIGHT breast 8 centimeters from the nipple.  RECOMMENDATION: 1. Recommend ultrasound-guided core biopsy of the more MEDIAL component of the calcifications in the 4 o'clock location 10 centimeters from the nipple. 2. Recommend stereotactic guided core biopsy of the more anterior calcifications in the MEDIAL aspect of the RIGHT breast. 3. Pending review of pathology results and clip placement, biopsy of the mass the 4 o'clock location 8 centimeters from the nipple may not be necessary. As needed, biopsy of this lesion can be performed.  I have discussed the findings and recommendations with the patient. If  applicable, a reminder letter will  be sent to the patient regarding the next appointment.  BI-RADS CATEGORY  5: Highly suggestive of malignancy.   Electronically Signed  By: Nolon Nations M.D.  On: 12/27/2020 14:31  CLINICAL DATA:  75 year old female presenting for ultrasound-guided biopsy of a mass with calcifications in the medial right breast  EXAM: ULTRASOUND GUIDED RIGHT BREAST CORE NEEDLE BIOPSY  COMPARISON:  Previous exam(s).  PROCEDURE: I met with the patient and we discussed the procedure of ultrasound-guided biopsy, including benefits and alternatives. We discussed the high likelihood of a successful procedure. We discussed the risks of the procedure, including infection, bleeding, tissue injury, clip migration, and inadequate sampling. Informed written consent was given. The usual time-out protocol was performed immediately prior to the procedure.  Lesion quadrant: Lower inner quadrant  Using sterile technique and 1% Lidocaine as local anesthetic, under direct ultrasound visualization, a 14 gauge spring-loaded device was used to perform biopsy of a mass with calcifications in the right breast at 4 o'clock, 10 cm from the nipple using an inferomedial approach. At the conclusion of the procedure a ribbon shaped tissue marker clip was deployed into the biopsy cavity. Follow up 2 view mammogram was performed and dictated separately.  IMPRESSION: Ultrasound guided biopsy of a mass with calcifications in the right breast at 4 o'clock, 10 cm from the nipple. No apparent complications.  Electronically Signed: By: Ammie Ferrier M.D. On: 01/16/2021 08:20  CLINICAL DATA:  75 year old female presenting for stereotactic biopsy of calcifications anterior breast  EXAM: RIGHT BREAST STEREOTACTIC CORE NEEDLE BIOPSY  COMPARISON:  Previous exams.  FINDINGS: The patient and I discussed the procedure of stereotactic-guided biopsy including benefits and  alternatives. We discussed the high likelihood of a successful procedure. We discussed the risks of the procedure including infection, bleeding, tissue injury, clip migration, and inadequate sampling. Informed written consent was given. The usual time out protocol was performed immediately prior to the procedure.  Using sterile technique and 1% Lidocaine as local anesthetic, under stereotactic guidance, a 9 gauge vacuum assisted device was used to perform core needle biopsy of calcifications in the lower-inner quadrant of the right breast using a medial approach. Specimen radiograph was performed showing calcifications in multiple core samples. Specimens with calcifications are identified for pathology.  Lesion quadrant: Lower inner quadrant  At the conclusion of the procedure, and X shaped tissue marker clip was deployed into the biopsy cavity. Follow-up 2-view mammogram was performed and dictated separately.  IMPRESSION: Stereotactic-guided biopsy of calcifications in the lower inner anterior right breast. No apparent complications.  Electronically Signed: By: Ammie Ferrier M.D. On: 01/16/2021 10:23   Past Surgical History Conni Slipper, RN; 01/23/2021 8:01 AM) Breast Biopsy  Right. Oral Surgery   Diagnostic Studies History Conni Slipper, RN; 01/23/2021 8:01 AM) Colonoscopy  1-5 years ago Mammogram  1-3 years ago Pap Smear  1-5 years ago  Allergies Rodman Key K. Shahin Knierim, MD; 01/23/2021 12:19 PM) No Known Allergies  [01/21/2021]:  Medication History Victoria Huang. Aarion Metzgar, MD; 01/23/2021 12:19 PM) Medications Reconciled  amLODIPine Besylate  (5MG Tablet, Oral) Active. Aspirin  (81MG Tablet DR, Oral) Active. Rosuvastatin Calcium  (20MG Tablet, Oral) Active.  Social History Conni Slipper, RN; 01/23/2021 8:01 AM) Alcohol use  Occasional alcohol use. Caffeine use  Coffee. No drug use  Tobacco use  Never smoker.  Family History Conni Slipper, RN; 01/23/2021 8:01 AM) Breast Cancer   Sister. Diabetes Mellitus  Sister. Kidney Disease  Sister. Prostate Cancer  Brother.  Pregnancy / Birth History Conni Slipper, RN;  01/23/2021 8:01 AM) Age at menarche  31 years. Age of menopause  <45 Contraceptive History  Oral contraceptives. Maternal age  61-25 Para  2 Regular periods   Other Problems Conni Slipper, RN; 01/23/2021 8:01 AM) High blood pressure      Review of Systems Conni Slipper RN; 01/23/2021 8:01 AM) General Not Present- Appetite Loss, Chills, Fatigue, Fever, Night Sweats, Weight Gain and Weight Loss. Skin Not Present- Change in Wart/Mole, Dryness, Hives, Jaundice, New Lesions, Non-Healing Wounds, Rash and Ulcer. HEENT Not Present- Earache, Hearing Loss, Hoarseness, Nose Bleed, Oral Ulcers, Ringing in the Ears, Seasonal Allergies, Sinus Pain, Sore Throat, Visual Disturbances, Wears glasses/contact lenses and Yellow Eyes. Respiratory Not Present- Bloody sputum, Chronic Cough, Difficulty Breathing, Snoring and Wheezing. Breast Not Present- Breast Mass, Breast Pain, Nipple Discharge and Skin Changes. Cardiovascular Not Present- Chest Pain, Difficulty Breathing Lying Down, Leg Cramps, Palpitations, Rapid Heart Rate, Shortness of Breath and Swelling of Extremities. Gastrointestinal Not Present- Abdominal Pain, Bloating, Bloody Stool, Change in Bowel Habits, Chronic diarrhea, Constipation, Difficulty Swallowing, Excessive gas, Gets full quickly at meals, Hemorrhoids, Indigestion, Nausea, Rectal Pain and Vomiting. Female Genitourinary Not Present- Frequency, Nocturia, Painful Urination, Pelvic Pain and Urgency. Musculoskeletal Not Present- Back Pain, Joint Pain, Joint Stiffness, Muscle Pain, Muscle Weakness and Swelling of Extremities. Neurological Present- Tingling. Not Present- Decreased Memory, Fainting, Headaches, Numbness, Seizures, Tremor, Trouble walking and Weakness. Psychiatric Not Present- Anxiety, Bipolar, Change in Sleep Pattern, Depression, Fearful and Frequent  crying. Endocrine Not Present- Cold Intolerance, Excessive Hunger, Hair Changes, Heat Intolerance, Hot flashes and New Diabetes. Hematology Not Present- Blood Thinners, Easy Bruising, Excessive bleeding, Gland problems, HIV and Persistent Infections.   Physical Exam Rodman Key K. Travus Oren MD; 01/23/2021 12:19 PM)  The physical exam findings are as follows: Note: Constitutional: WDWN in NAD, conversant, no obvious deformities; resting comfortably Eyes: Pupils equal, round; sclera anicteric; moist conjunctiva; no lid lag HENT: Oral mucosa moist; good dentition Neck: No masses palpated, trachea midline; no thyromegaly Lungs: CTA bilaterally; normal respiratory effort Breasts: symmetric; slight ecchymosis from biopsy; no palpable masses or lymphadenopathy; no nipple changes CV: Regular rate and rhythm; no murmurs; extremities well-perfused with no edema Abd: +bowel sounds, soft, non-tender, no palpable organomegaly; no palpable hernias Musc: Normal gait; no apparent clubbing or cyanosis in extremities Lymphatic: No palpable cervical or axillary lymphadenopathy Skin: Warm, dry; no sign of jaundice Psychiatric - alert and oriented x 4; calm mood and affect    Assessment & Plan Rodman Key K. Vala Raffo MD; 01/23/2021 12:22 PM)  DUCTAL CARCINOMA IN SITU (DCIS) OF RIGHT BREAST (D05.11)  Current Plans Schedule for Surgery - Right mastectomy with sentinel lymph node biopsy.  The surgical procedure has been discussed with the patient.  Potential risks, benefits, alternative treatments, and expected outcomes have been explained.  All of the patient's questions at this time have been answered.  The likelihood of reaching the patient's treatment goal is good.  The patient understand the proposed surgical procedure and wishes to proceed. Note: Due to the large area of DCIS and multifocal disease, we recommend right mastectomy with sentinel lymph node biopsy, followed by anti-estrogens.  We will perform genetic  testing prior to surgery.  There is no indication to pursue MRI and other right breast biopsies since she will have mastectomy.  Depending on genetic testing, she may want to have a left prophylactic mastectomy.    We will go ahead and post her right mastectomy with SLNB, but can adjust to add on a left mastectomy if needed.  She does not want to consider reconstruction.  Victoria Huang. Georgette Dover, MD, [facility]  General/ Trauma Surgery   01/23/2021 12:23 PM

## 2021-01-23 NOTE — Progress Notes (Signed)
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   HISTORY OF PRESENT ILLNESS: Victoria Huang is a 75 y.o. female seen in the multidisciplinary breast clinic for a new diagnosis of right breast cancer. The patient was noted to have calcifications seen on screening mammography in the lower nner quadrant of the right breast.  Diagnostic imaging showed calcifications in the lower inner quadrant measuring up to 9.1 cm, there were also associated tubular calcifications measuring 2.5 cm in the 4 o'clock position.  Evaluation of the axilla was negative for adenopathy by ultrasound, she had additional calcifications that were not sampled with a biopsy at 4:00 and in the lower inner quadrant showed high-grade DCIS with calcifications and necrosis, her tumor was ER/PR positive.  She is seen today to discuss treatment recommendations of her cancer.    PREVIOUS RADIATION THERAPY: No   PAST MEDICAL HISTORY:  Past Medical History:  Diagnosis Date   Breast cancer ([place])        PAST SURGICAL HISTORY:No past surgical history on file.    FAMILY HISTORY:  Family History  Problem Relation Age of Onset   Lung cancer Father    Breast cancer Sister 29   Breast cancer Daughter 73     SOCIAL HISTORY:  reports that she has never smoked. She has never used smokeless tobacco. She reports current alcohol use. The patient is single, accompanied by her daughter, and lives in [place]: Patient has no known allergies.   MEDICATIONS:  Current Outpatient Medications  Medication Sig Dispense Refill   amLODipine (NORVASC) 5 MG tablet Take 1 tablet by mouth at bedtime.     rosuvastatin (CRESTOR) 20  MG tablet Take 20 mg by mouth at bedtime.     No current facility-administered medications for this encounter.     REVIEW OF SYSTEMS: On review of systems, the patient reports that she is doing fairly well.  No specific breast complaints are verbalized.    PHYSICAL EXAM:  Wt Readings from Last 3 Encounters:  01/23/21 155 lb 8 oz (70.5 kg)   Temp Readings from Last 3 Encounters:  01/23/21 97.8 F (36.6 C) (Temporal)   BP Readings from Last 3 Encounters:  01/23/21 (!) 170/78   Pulse Readings from Last 3 Encounters:  01/23/21 72    In general this is a well appearing  female in no acute distress. She's alert and oriented x4 and appropriate throughout the examination. Cardiopulmonary assessment is negative for acute distress and she exhibits normal effort. Bilateral breast exam is deferred.    ECOG = 0  0 - Asymptomatic (Fully active, able to carry on all predisease activities without restriction)  1 - Symptomatic but completely ambulatory (Restricted in physically strenuous activity but ambulatory and able to carry out work of a light or sedentary nature. For example, light housework, office work)  2 - Symptomatic, <50% in bed during the day (Ambulatory and capable of all self care but unable to carry out any work activities. Up and about more than 50% of waking hours)  3 - Symptomatic, >50% in bed, but not bedbound (Capable of only limited self-care, confined to bed or chair  50% or more of waking hours)  4 - Bedbound (Completely disabled. Cannot carry on any self-care. Totally confined to bed or chair)  5 - Death   Eustace Pen MM, Creech RH, Tormey DC, et al. [phone]). "Toxicity and response criteria of the [facility]". [place] Oncol. 5 (6): 649-55    LABORATORY DATA:  Lab Results  Component Value Date   WBC 5.7 01/23/2021   HGB 12.1 01/23/2021   HCT 37.5 01/23/2021   MCV 80.8 01/23/2021   PLT 277 01/23/2021   Lab Results  Component Value Date    NA 140 01/23/2021   K 3.9 01/23/2021   CL 109 01/23/2021   CO2 22 01/23/2021   Lab Results  Component Value Date   ALT 12 01/23/2021   AST 17 01/23/2021   ALKPHOS 79 01/23/2021   BILITOT 0.4 01/23/2021      RADIOGRAPHY: US BREAST LTD UNI RIGHT INC AXILLA  Result Date: 12/27/2020 CLINICAL DATA:  Patient returns after new baseline study for evaluation of possible RIGHT breast asymmetry and RIGHT breast calcifications. EXAM: DIGITAL DIAGNOSTIC UNILATERAL RIGHT MAMMOGRAM WITH TOMOSYNTHESIS AND CAD; ULTRASOUND RIGHT BREAST LIMITED TECHNIQUE: Right digital diagnostic mammography and breast tomosynthesis was performed. The images were evaluated with computer-aided detection.; Targeted ultrasound examination of the right breast was performed COMPARISON:  12/06/2020 ACR Breast Density Category b: There are scattered areas of fibroglandular density. FINDINGS: Spot compression views are performed of the LATERAL portion of the RIGHT breast, confirming presence of asymmetry/possible mass in the UPPER-OUTER QUADRANT. Magnification views are performed of calcifications in the LOWER INNER QUADRANT of the RIGHT breast. These views demonstrate extensive faint linear calcifications spanning 9.1 x 2.9 x 1.9 centimeters. Along the most MEDIAL aspect of the calcifications, there is associated parenchymal density. A small mass associated with calcifications is identified in the LOWER portion of the RIGHT breast. On physical exam, I palpate no abnormality in the MEDIAL aspect of the RIGHT breast. Targeted ultrasound is performed, showing numerous hypoechoic parallel oval masses in the UPPER-OUTER QUADRANT of the RIGHT breast, largest measuring 0.9 centimeters. No solid mass or areas of acoustic shadowing identified in the LATERAL aspect of the RIGHT breast. In the 4 o'clock location of the RIGHT breast 8 centimeters from the nipple, there is an oval circumscribed hypoechoic mass containing calcifications and associated  posterior acoustic shadowing. This mass measures 0.5 x 0.3 x 0.6 centimeters. In the 4 o'clock location of the RIGHT breast 10 centimeters from the nipple, there is tubular shaped hypoechoic tissue containing calcifications and spanning 2.5 x 0.8 x 0.4 centimeters. These calcifications are best appreciated at real-time imaging. Evaluation of the RIGHT axilla is negative for adenopathy. IMPRESSION: 1. Suspicious calcifications and associated parenchymal density in the MEDIAL aspect of the RIGHT breast warranting tissue diagnosis. 2. No suspicious abnormality in the UPPER-OUTER QUADRANT of the RIGHT breast. 3. Indeterminate hypoechoic mass with internal calcifications in the 4 o'clock location of the RIGHT breast 8 centimeters from the nipple. RECOMMENDATION: 1. Recommend ultrasound-guided core biopsy of the more MEDIAL component of the calcifications in the 4 o'clock location 10 centimeters from the nipple. 2. Recommend stereotactic guided core biopsy of the more anterior calcifications in the MEDIAL aspect of the RIGHT breast. 3. Pending review of pathology results and clip placement, biopsy of the mass the 4 o'clock location 8 centimeters from the nipple may not be necessary. As needed, biopsy of this lesion can be performed. I have discussed the findings and recommendations with the  patient. If applicable, a reminder letter will be sent to the patient regarding the next appointment. BI-RADS CATEGORY  5: Highly suggestive of malignancy. Electronically Signed   By: Nolon Nations M.D.   On: 12/27/2020 14:31   MM DIAG BREAST TOMO UNI RIGHT  Result Date: 12/27/2020 CLINICAL DATA:  Patient returns after new baseline study for evaluation of possible RIGHT breast asymmetry and RIGHT breast calcifications. EXAM: DIGITAL DIAGNOSTIC UNILATERAL RIGHT MAMMOGRAM WITH TOMOSYNTHESIS AND CAD; ULTRASOUND RIGHT BREAST LIMITED TECHNIQUE: Right digital diagnostic mammography and breast tomosynthesis was performed. The images  were evaluated with computer-aided detection.; Targeted ultrasound examination of the right breast was performed COMPARISON:  12/06/2020 ACR Breast Density Category b: There are scattered areas of fibroglandular density. FINDINGS: Spot compression views are performed of the LATERAL portion of the RIGHT breast, confirming presence of asymmetry/possible mass in the UPPER-OUTER QUADRANT. Magnification views are performed of calcifications in the LOWER INNER QUADRANT of the RIGHT breast. These views demonstrate extensive faint linear calcifications spanning 9.1 x 2.9 x 1.9 centimeters. Along the most MEDIAL aspect of the calcifications, there is associated parenchymal density. A small mass associated with calcifications is identified in the LOWER portion of the RIGHT breast. On physical exam, I palpate no abnormality in the MEDIAL aspect of the RIGHT breast. Targeted ultrasound is performed, showing numerous hypoechoic parallel oval masses in the UPPER-OUTER QUADRANT of the RIGHT breast, largest measuring 0.9 centimeters. No solid mass or areas of acoustic shadowing identified in the LATERAL aspect of the RIGHT breast. In the 4 o'clock location of the RIGHT breast 8 centimeters from the nipple, there is an oval circumscribed hypoechoic mass containing calcifications and associated posterior acoustic shadowing. This mass measures 0.5 x 0.3 x 0.6 centimeters. In the 4 o'clock location of the RIGHT breast 10 centimeters from the nipple, there is tubular shaped hypoechoic tissue containing calcifications and spanning 2.5 x 0.8 x 0.4 centimeters. These calcifications are best appreciated at real-time imaging. Evaluation of the RIGHT axilla is negative for adenopathy. IMPRESSION: 1. Suspicious calcifications and associated parenchymal density in the MEDIAL aspect of the RIGHT breast warranting tissue diagnosis. 2. No suspicious abnormality in the UPPER-OUTER QUADRANT of the RIGHT breast. 3. Indeterminate hypoechoic mass with  internal calcifications in the 4 o'clock location of the RIGHT breast 8 centimeters from the nipple. RECOMMENDATION: 1. Recommend ultrasound-guided core biopsy of the more MEDIAL component of the calcifications in the 4 o'clock location 10 centimeters from the nipple. 2. Recommend stereotactic guided core biopsy of the more anterior calcifications in the MEDIAL aspect of the RIGHT breast. 3. Pending review of pathology results and clip placement, biopsy of the mass the 4 o'clock location 8 centimeters from the nipple may not be necessary. As needed, biopsy of this lesion can be performed. I have discussed the findings and recommendations with the patient. If applicable, a reminder letter will be sent to the patient regarding the next appointment. BI-RADS CATEGORY  5: Highly suggestive of malignancy. Electronically Signed   By: Nolon Nations M.D.   On: 12/27/2020 14:31   MM CLIP PLACEMENT RIGHT  Result Date: 01/16/2021 CLINICAL DATA:  Post biopsy mammogram right breast for clip placement. EXAM: DIAGNOSTIC RIGHT MAMMOGRAM POST ULTRASOUND AND STEREOTACTIC BIOPSIES COMPARISON:  Previous exam(s). FINDINGS: Mammographic images were obtained following ultrasound guided biopsy of a mass with calcifications in the lower-inner posterior right breast and stereotactic biopsy of calcifications in the lower inner anterior right breast. The biopsy marking clips are in expected position at the sites  of biopsy. The 2 clips lie 5.0 cm apart, however note that the calcifications extend at least 3 cm posterior to the ribbon shaped biopsy marking clip. The asymmetry in the upper-outer quadrant of the right breast identified on the screening mammogram is again visualized. Additionally, there are loosely scattered calcifications in a segmental orientation in the upper-outer right breast in the middle to posterior depth. Also seen is the 7 mm mass with calcifications in the lower-inner quadrant of the right breast described on the  diagnostic report which was not biopsied. IMPRESSION: 1. Appropriate positioning of the ribbon shaped biopsy marking clip at the site of biopsy in the lower inner posterior right breast. Appropriate positioning of the X shaped biopsy marking clip in the lower inner anterior right breast. The clips lie 5 cm apart, however the calcifications extend at least 3 cm posterior to the ribbon shaped biopsy marking clip. 2. There are multiple other findings in the right breast which are indeterminate, including a mass with calcifications in the lower inner posterior right breast, an asymmetry in the upper outer right breast and scattered segmental calcifications in the upper-outer posterior right breast. Final Assessment: Post Procedure Mammograms for Marker Placement Electronically Signed   By: Ammie Ferrier M.D.   On: 01/16/2021 10:42  MM RT BREAST BX W LOC DEV 1ST LESION IMAGE BX SPEC STEREO GUIDE  Addendum Date: 01/17/2021   ADDENDUM REPORT: 01/17/2021 13:56 ADDENDUM: Pathology revealed DUCTAL CARCINOMA IN SITU, HIGH-GRADE WITH NECROSIS AND CALCIFICATIONS of the Right breast, 4 o'clock, 10cmfn, (ribbon clip). This was found to be concordant by Dr. Ammie Ferrier. Pathology revealed DUCTAL CARCINOMA IN SITU, INTERMEDIATE TO HIGH-GRADE WITH NECROSIS AND CALCIFICATIONS of the Right breast, lower inner anterior, (X shaped clip). This was found to be concordant by Dr. Ammie Ferrier. Pathology results were discussed with the patient by telephone. The patient reported doing well after the biopsies with tenderness at the sites. Post biopsy instructions and care were reviewed and questions were answered. The patient was encouraged to call [facility] for any additional concerns. My direct phone number was provided. The patient was referred to [facility] on January 23, 2021. If breast conservation is being considered,  additional biopsies are recommended. This could include US biopsy of the mass in the Right breast at 4:00, 8 cmfn, calcifications in the upper outer Right breast, and/or stereotactic biopsy of the asymmetry in the upper outer Right breast. Consideration for a bilateral breast MRI given the broad extent of disease and multiple findings before perusing additional biopsies to look for the most suspicious target(s) for biopsy. Pathology results reported by Terie Purser, RN on 01/17/2021. Electronically Signed   By: Ammie Ferrier M.D.   On: 01/17/2021 13:56   Result Date: 01/17/2021 CLINICAL DATA:  75 year old female presenting for stereotactic biopsy of calcifications anterior breast EXAM: RIGHT BREAST STEREOTACTIC CORE NEEDLE BIOPSY COMPARISON:  Previous exams. FINDINGS: The patient and I discussed the procedure of stereotactic-guided biopsy including benefits and alternatives. We discussed the high likelihood of a successful procedure. We discussed the risks of the procedure including infection, bleeding, tissue injury, clip migration, and inadequate sampling. Informed written consent was given. The usual time out protocol was performed immediately prior to the procedure. Using sterile technique and 1% Lidocaine as local anesthetic, under stereotactic guidance, a 9 gauge vacuum assisted device was used to perform core needle biopsy of calcifications in the lower-inner quadrant of  the right breast using a medial approach. Specimen radiograph was performed showing calcifications in multiple core samples. Specimens with calcifications are identified for pathology. Lesion quadrant: Lower inner quadrant At the conclusion of the procedure, and X shaped tissue marker clip was deployed into the biopsy cavity. Follow-up 2-view mammogram was performed and dictated separately. IMPRESSION: Stereotactic-guided biopsy of calcifications in the lower inner anterior right breast. No apparent complications. Electronically Signed:  By: Ammie Ferrier M.D. On: 01/16/2021 10:23  [place] RT BREAST BX W LOC DEV 1ST LESION IMG BX SPEC US GUIDE  Addendum Date: 01/17/2021   ADDENDUM REPORT: 01/17/2021 13:57 : Pathology revealed DUCTAL CARCINOMA IN SITU, HIGH-GRADE WITH NECROSIS AND CALCIFICATIONS of the Right breast, 4 o'clock, 10cmfn, (ribbon clip). This was found to be concordant by Dr. Ammie Ferrier. Pathology revealed DUCTAL CARCINOMA IN SITU, INTERMEDIATE TO HIGH-GRADE WITH NECROSIS AND CALCIFICATIONS of the Right breast, lower inner anterior, (X shaped clip). This was found to be concordant by Dr. Ammie Ferrier. Pathology results were discussed with the patient by telephone. The patient reported doing well after the biopsies with tenderness at the sites. Post biopsy instructions and care were reviewed and questions were answered. The patient was encouraged to call [facility] for any additional concerns. My direct phone number was provided. The patient was referred to [facility] at [facility] on January 23, 2021. If breast conservation is being considered, additional biopsies are recommended. This could include US biopsy of the mass in the Right breast at 4:00, 8 cmfn, calcifications in the upper outer Right breast, and/or stereotactic biopsy of the asymmetry in the upper outer Right breast. Consideration for a bilateral breast MRI given the broad extent of disease and multiple findings before perusing additional biopsies to look for the most suspicious target(s) for biopsy. Pathology results reported by Terie Purser, RN on 01/17/2021. Electronically Signed   By: Ammie Ferrier M.D.   On: 01/17/2021 13:57   Result Date: 01/17/2021 CLINICAL DATA:  75 year old female presenting for ultrasound-guided biopsy of a mass with calcifications in the medial right breast EXAM: ULTRASOUND GUIDED RIGHT BREAST CORE NEEDLE BIOPSY COMPARISON:  Previous exam(s).  PROCEDURE: I met with the patient and we discussed the procedure of ultrasound-guided biopsy, including benefits and alternatives. We discussed the high likelihood of a successful procedure. We discussed the risks of the procedure, including infection, bleeding, tissue injury, clip migration, and inadequate sampling. Informed written consent was given. The usual time-out protocol was performed immediately prior to the procedure. Lesion quadrant: Lower inner quadrant Using sterile technique and 1% Lidocaine as local anesthetic, under direct ultrasound visualization, a 14 gauge spring-loaded device was used to perform biopsy of a mass with calcifications in the right breast at 4 o'clock, 10 cm from the nipple using an inferomedial approach. At the conclusion of the procedure a ribbon shaped tissue marker clip was deployed into the biopsy cavity. Follow up 2 view mammogram was performed and dictated separately. IMPRESSION: Ultrasound guided biopsy of a mass with calcifications in the right breast at 4 o'clock, 10 cm from the nipple. No apparent complications. Electronically Signed: By: Ammie Ferrier M.D. On: 01/16/2021 08:20      IMPRESSION/PLAN: 1. ER/PR positive high-grade DCIS of the right breast.  We discussed the pathology findings and reviewed the nature of noninvasive right breast disease. The consensus from the breast conference includes right mastectomy with sentinel lymph node biopsy.  She is contemplating bilateral mastectomies.  Based on surgical plans for mastectomy, there would not be an anticipated role for radiotherapy.  We would be happy to see her back as needed.  In a visit lasting 45 minutes, greater than 50% of the time was spent face to face reviewing her case, as well as in preparation of, discussing, and coordinating the patient's care.   ________________________________   Jodelle Gross, MD, PhD     **Disclaimer: This note was dictated with voice recognition software.  Similar sounding words can inadvertently be transcribed and this note may contain transcription errors which may not have been corrected upon publication of note.**

## 2021-01-24 ENCOUNTER — Telehealth: Payer: Self-pay | Admitting: Hematology and Oncology

## 2021-01-24 ENCOUNTER — Telehealth: Payer: Self-pay | Admitting: *Deleted

## 2021-01-24 ENCOUNTER — Encounter: Payer: Self-pay | Admitting: *Deleted

## 2021-01-24 NOTE — Telephone Encounter (Signed)
Scheduled appointment per 06/23 sch msg. Patient is aware. 

## 2021-01-24 NOTE — Telephone Encounter (Signed)
Spoke to pt concerning [place] from 6.22.22. Denies questions or concerns regarding dx or treatment care plan. Encourage pt to call with needs. Received verbal understanding. Contact information provided.

## 2021-01-28 ENCOUNTER — Telehealth: Payer: Self-pay | Admitting: *Deleted

## 2021-01-28 NOTE — Telephone Encounter (Signed)
Received call from pt with complaint of sharp left sided breast pain x1 incident.  Pt states she was getting ready for bed last night and experienced severe left sided breast pain.  Pt states pain resolved after a few minutes.  Denies any recent injury, trauma or swelling.  MD notified and states may have pulled a muscle.  Pt educated to monitor breast and if pt experiences constant pain to call our office.  Pt verbalized understanding.

## 2021-01-31 ENCOUNTER — Encounter: Payer: Self-pay | Admitting: Genetic Counselor

## 2021-01-31 DIAGNOSIS — Z1379 Encounter for other screening for genetic and chromosomal anomalies: Secondary | ICD-10-CM | POA: Insufficient documentation

## 2021-02-05 ENCOUNTER — Telehealth: Payer: Self-pay | Admitting: Genetic Counselor

## 2021-02-05 NOTE — Telephone Encounter (Signed)
Contacted patient in attempt to disclose results of genetic testing.  LVM with contact information requesting a call back.  

## 2021-02-06 ENCOUNTER — Other Ambulatory Visit: Payer: Self-pay

## 2021-02-06 ENCOUNTER — Encounter (HOSPITAL_BASED_OUTPATIENT_CLINIC_OR_DEPARTMENT_OTHER): Payer: Self-pay | Admitting: Surgery

## 2021-02-07 ENCOUNTER — Encounter (HOSPITAL_BASED_OUTPATIENT_CLINIC_OR_DEPARTMENT_OTHER)
Admission: RE | Admit: 2021-02-07 | Discharge: 2021-02-07 | Disposition: A | Discharge status: Home / Self Care | Payer: Medicare Other | Source: Ambulatory Visit | Attending: Surgery | Admitting: Surgery

## 2021-02-07 DIAGNOSIS — Z01812 Encounter for preprocedural laboratory examination: Secondary | ICD-10-CM | POA: Diagnosis present

## 2021-02-07 MED ORDER — CHLORHEXIDINE GLUCONATE CLOTH 2 % EX PADS: 6.0000 | MEDICATED_PAD | Freq: Once | CUTANEOUS | Status: DC

## 2021-02-07 NOTE — Progress Notes (Signed)
      Enhanced Recovery after Surgery for Orthopedics Enhanced Recovery after Surgery is a protocol used to improve the stress on your body and your recovery after surgery.  Patient Instructions  The night before surgery:  No food after midnight. ONLY clear liquids after midnight  The day of surgery (if you do NOT have diabetes):  Drink ONE (1) Pre-Surgery Clear Ensure as directed.   This drink was given to you during your hospital  pre-op appointment visit. The pre-op nurse will instruct you on the time to drink the  Pre-Surgery Ensure depending on your surgery time. Finish the drink at the designated time by the pre-op nurse.  Nothing else to drink after completing the  Pre-Surgery Clear Ensure.  The day of surgery (if you have diabetes): Drink ONE (1) Gatorade 2 (G2) as directed. This drink was given to you during your hospital  pre-op appointment visit.  The pre-op nurse will instruct you on the time to drink the   Gatorade 2 (G2) depending on your surgery time. Color of the Gatorade may vary. Red is not allowed. Nothing else to drink after completing the  Gatorade 2 (G2).  Surgical soap given to patient, instructions given, patient verbalized understanding.           If you have questions, please contact your surgeon's office. 

## 2021-02-11 ENCOUNTER — Telehealth: Payer: Self-pay | Admitting: Genetic Counselor

## 2021-02-11 ENCOUNTER — Ambulatory Visit: Payer: Self-pay | Admitting: Genetic Counselor

## 2021-02-11 ENCOUNTER — Encounter: Payer: Self-pay | Admitting: Genetic Counselor

## 2021-02-11 DIAGNOSIS — Z8042 Family history of malignant neoplasm of prostate: Secondary | ICD-10-CM

## 2021-02-11 DIAGNOSIS — D0511 Intraductal carcinoma in situ of right breast: Secondary | ICD-10-CM

## 2021-02-11 DIAGNOSIS — Z803 Family history of malignant neoplasm of breast: Secondary | ICD-10-CM

## 2021-02-11 DIAGNOSIS — Z1379 Encounter for other screening for genetic and chromosomal anomalies: Secondary | ICD-10-CM

## 2021-02-11 NOTE — Telephone Encounter (Signed)
Revealed negative genetic testing.  Discussed that we do not know why she has breast cancer or why there is cancer in the family. It could be familial, due to a different gene that we are not testing, or maybe our current technology may not be able to pick something up.  It will be important for her to keep in contact with genetics to keep up with whether additional testing may be needed.  Also disclosed result to daughter Christa per patient's request.

## 2021-02-12 NOTE — Progress Notes (Signed)
HPI:  [name] was previously seen in the [place] clinic due to a personal and family history of cancer and concerns regarding a hereditary predisposition to cancer. Please refer to our prior cancer genetics clinic note for more information regarding our discussion, assessment and recommendations, at the time. [name] recent genetic test results were disclosed to her, as were recommendations warranted by these results. These results and recommendations are discussed in more detail below.  CANCER HISTORY:  Oncology History  Ductal carcinoma in situ (DCIS) of right breast  01/17/2021 Initial Diagnosis   Screening mammogram detected right breast asymmetry and calcifications.  2 areas were detected 4 o'clock position 2.5 cm calcifications, LIQ: 9.1 cm, classifications.  Ultrasound-guided biopsy revealed high-grade DCIS with calcifications and necrosis ER 100%, PR 100%   02/04/2021 Genetic Testing   Negative hereditary cancer genetic testing: no pathogenic variants detected in [facility] CancerNext-Expanded +RNAinsight Panel.  The report date is February 04, 2021.    The CustomNext-Cancer+RNAinsight panel offered by [facility] includes sequencing and rearrangement analysis for the following 47 genes:  APC, ATM, AXIN2, BARD1, BMPR1A, BRCA1, BRCA2, BRIP1, CDH1, CDK4, CDKN2A, CHEK2, DICER1, EPCAM, GREM1, HOXB13, MEN1, MLH1, MSH2, MSH3, MSH6, MUTYH, NBN, NF1, NF2, NTHL1, PALB2, PMS2, POLD1, POLE, PTEN, RAD51C, RAD51D, RECQL, RET, SDHA, SDHAF2, SDHB, SDHC, SDHD, SMAD4, SMARCA4, STK11, TP53, TSC1, TSC2, and VHL.  RNA data is routinely analyzed for use in variant interpretation for all genes.      FAMILY HISTORY:  We obtained a detailed, 4-generation family history.  Significant diagnoses are listed below: Family History  Problem Relation Age of Onset   Lung cancer Father        dx 70s; smoking hx   Breast cancer Sister 7   Prostate cancer Brother        dx unknown age   64  Brother        unknown type; dx after 80   Breast cancer Daughter 53      GENETIC TEST RESULTS: Genetic testing reported out on February 04, 2021.  The [facility] CustomNext-Cancer +RNAinsight  Panel found no pathogenic mutations. The CustomNext-Cancer+RNAinsight panel offered by [facility] includes sequencing and rearrangement analysis for the following 47 genes:  APC, ATM, AXIN2, BARD1, BMPR1A, BRCA1, BRCA2, BRIP1, CDH1, CDK4, CDKN2A, CHEK2, DICER1, EPCAM, GREM1, HOXB13, MEN1, MLH1, MSH2, MSH3, MSH6, MUTYH, NBN, NF1, NF2, NTHL1, PALB2, PMS2, POLD1, POLE, PTEN, RAD51C, RAD51D, RECQL, RET, SDHA, SDHAF2, SDHB, SDHC, SDHD, SMAD4, SMARCA4, STK11, TP53, TSC1, TSC2, and VHL.  RNA data is routinely analyzed for use in variant interpretation for all genes.  The test report has been scanned into EPIC and is located under the Molecular Pathology section of the Results Review tab.  A portion of the result report is included below for reference.     We discussed with [name] that because current genetic testing is not perfect, it is possible there may be a gene mutation in one of these genes that current testing cannot detect, but that chance is small.  We also discussed, that there could be another gene that has not yet been discovered, or that we have not yet tested, that is responsible for the cancer diagnoses in the family. It is also possible there is a hereditary cause for the cancer in the family that [name] did not inherit and therefore was not identified in her testing.  Therefore, it is important to remain in touch with cancer genetics in the future so that we  can continue to offer [name] the most up to date genetic testing.    ADDITIONAL GENETIC TESTING: There are other genes that are associated with increased cancer risk that can be analyzed. Should [name] wish to pursue additional genetic testing, we are happy to discuss and coordinate this testing, at any time.    CANCER SCREENING  RECOMMENDATIONS: [name] test result is considered negative (normal).  This means that we have not identified a hereditary cause for her personal history of cancer at this time. Most cancers happen by chance and this negative test suggests that her cancer may fall into this category.    While reassuring, this does not definitively rule out a hereditary predisposition to cancer. It is still possible that there could be genetic mutations that are undetectable by current technology. There could be genetic mutations in genes that have not been tested or identified to increase cancer risk.  Therefore, it is recommended she continue to follow the cancer management and screening guidelines provided by her oncology and primary healthcare provider.   An individual's cancer risk and medical management are not determined by genetic test results alone. Overall cancer risk assessment incorporates additional factors, including personal medical history, family history, and any available genetic information that may result in a personalized plan for cancer prevention and surveillance  RECOMMENDATIONS FOR FAMILY MEMBERS:  Individuals in this family might be at some increased risk of developing cancer, over the general population risk, simply due to the family history of cancer.  We recommended women in this family have a yearly mammogram beginning at age 74, or 29 years younger than the earliest onset of cancer, an annual clinical breast exam, and perform monthly breast self-exams. Women in this family should also have a gynecological exam as recommended by their primary provider. Family members should be referred for colonoscopy starting at age 41 or as recommended by their physicians.    FOLLOW-UP: Lastly, we discussed with [name] that cancer genetics is a rapidly advancing field and it is possible that new genetic tests will be appropriate for her and/or her family members in the future. We encouraged her to  remain in contact with cancer genetics on an annual basis so we can update her personal and family histories and let her know of advances in cancer genetics that may benefit this family.   Our contact number was provided. [name] questions were answered to her satisfaction, and she knows she is welcome to call us at anytime with additional questions or concerns.     Victoria Huang M. Joette Catching, [place], [facility] Genetic Counselor [email or web address] [email or web address] (P) [phone]

## 2021-02-13 ENCOUNTER — Encounter (HOSPITAL_BASED_OUTPATIENT_CLINIC_OR_DEPARTMENT_OTHER)
Admission: RE | Disposition: A | Discharge status: Home / Self Care | Payer: Self-pay | Source: Ambulatory Visit | Attending: Surgery

## 2021-02-13 ENCOUNTER — Ambulatory Visit (HOSPITAL_BASED_OUTPATIENT_CLINIC_OR_DEPARTMENT_OTHER): Payer: Medicare Other | Admitting: Certified Registered"

## 2021-02-13 ENCOUNTER — Encounter (HOSPITAL_BASED_OUTPATIENT_CLINIC_OR_DEPARTMENT_OTHER): Payer: Self-pay | Admitting: Surgery

## 2021-02-13 ENCOUNTER — Other Ambulatory Visit: Payer: Self-pay

## 2021-02-13 ENCOUNTER — Ambulatory Visit (HOSPITAL_BASED_OUTPATIENT_CLINIC_OR_DEPARTMENT_OTHER)
Admission: RE | Admit: 2021-02-13 | Discharge: 2021-02-14 | Disposition: A | Discharge status: Home / Self Care | Payer: Medicare Other | Source: Ambulatory Visit | Attending: Surgery | Admitting: Surgery

## 2021-02-13 ENCOUNTER — Encounter (HOSPITAL_COMMUNITY)
Admission: RE | Admit: 2021-02-13 | Discharge: 2021-02-13 | Disposition: A | Discharge status: Home / Self Care | Payer: Medicare Other | Source: Ambulatory Visit | Attending: Surgery | Admitting: Surgery

## 2021-02-13 DIAGNOSIS — N6011 Diffuse cystic mastopathy of right breast: Secondary | ICD-10-CM | POA: Diagnosis not present

## 2021-02-13 DIAGNOSIS — D0511 Intraductal carcinoma in situ of right breast: Secondary | ICD-10-CM | POA: Diagnosis present

## 2021-02-13 DIAGNOSIS — Z79899 Other long term (current) drug therapy: Secondary | ICD-10-CM | POA: Insufficient documentation

## 2021-02-13 DIAGNOSIS — Z7982 Long term (current) use of aspirin: Secondary | ICD-10-CM | POA: Diagnosis not present

## 2021-02-13 HISTORY — PX: MASTECTOMY W/ SENTINEL NODE BIOPSY: SHX2001

## 2021-02-13 HISTORY — DX: Pure hypercholesterolemia, unspecified: E78.00

## 2021-02-13 HISTORY — DX: Essential (primary) hypertension: I10

## 2021-02-13 SURGERY — MASTECTOMY WITH SENTINEL LYMPH NODE BIOPSY
Anesthesia: General | Site: Breast | Laterality: Right

## 2021-02-13 MED ORDER — HYDROMORPHONE HCL 1 MG/ML IJ SOLN
INTRAMUSCULAR | Status: AC
  Filled 2021-02-13: qty 0.5

## 2021-02-13 MED ORDER — ACETAMINOPHEN 325 MG PO TABS: 650.0000 mg | ORAL_TABLET | Freq: Four times a day (QID) | ORAL | Status: DC | PRN

## 2021-02-13 MED ORDER — SODIUM CHLORIDE (PF) 0.9 % IJ SOLN
INTRAVENOUS | Status: DC | PRN
  Administered 2021-02-13: 5 mL via INTRAMUSCULAR

## 2021-02-13 MED ORDER — POTASSIUM CHLORIDE IN NACL 20-0.9 MEQ/L-% IV SOLN
INTRAVENOUS | Status: DC
  Filled 2021-02-13: qty 1000

## 2021-02-13 MED ORDER — ONDANSETRON 4 MG PO TBDP: 4.0000 mg | ORAL_TABLET | Freq: Four times a day (QID) | ORAL | Status: DC | PRN

## 2021-02-13 MED ORDER — CEFAZOLIN SODIUM-DEXTROSE 2-4 GM/100ML-% IV SOLN
2.0000 g | INTRAVENOUS | Status: AC
  Administered 2021-02-13: 2 g via INTRAVENOUS

## 2021-02-13 MED ORDER — DEXAMETHASONE SODIUM PHOSPHATE 10 MG/ML IJ SOLN
INTRAMUSCULAR | Status: AC
  Filled 2021-02-13: qty 1

## 2021-02-13 MED ORDER — OXYCODONE HCL 5 MG/5ML PO SOLN: 5.0000 mg | Freq: Once | ORAL | Status: DC | PRN

## 2021-02-13 MED ORDER — PROMETHAZINE HCL 25 MG/ML IJ SOLN: 6.2500 mg | INTRAMUSCULAR | Status: DC | PRN

## 2021-02-13 MED ORDER — ONDANSETRON HCL 4 MG/2ML IJ SOLN
INTRAMUSCULAR | Status: AC
  Filled 2021-02-13: qty 2

## 2021-02-13 MED ORDER — DIPHENHYDRAMINE HCL 12.5 MG/5ML PO ELIX: 12.5000 mg | ORAL_SOLUTION | Freq: Four times a day (QID) | ORAL | Status: DC | PRN

## 2021-02-13 MED ORDER — HYDROMORPHONE HCL 1 MG/ML IJ SOLN
0.2500 mg | INTRAMUSCULAR | Status: DC | PRN
  Administered 2021-02-13 (×3): 0.5 mg via INTRAVENOUS

## 2021-02-13 MED ORDER — ROSUVASTATIN CALCIUM 20 MG PO TABS
20.0000 mg | ORAL_TABLET | Freq: Every day | ORAL | Status: DC
  Filled 2021-02-13: qty 1

## 2021-02-13 MED ORDER — FENTANYL CITRATE (PF) 100 MCG/2ML IJ SOLN
INTRAMUSCULAR | Status: AC
  Filled 2021-02-13: qty 2

## 2021-02-13 MED ORDER — PROPOFOL 10 MG/ML IV BOLUS
INTRAVENOUS | Status: AC
  Filled 2021-02-13: qty 20

## 2021-02-13 MED ORDER — PHENYLEPHRINE HCL (PRESSORS) 10 MG/ML IV SOLN
INTRAVENOUS | Status: DC | PRN
  Administered 2021-02-13 (×2): 80 ug via INTRAVENOUS
  Administered 2021-02-13: 40 ug via INTRAVENOUS
  Administered 2021-02-13: 80 ug via INTRAVENOUS
  Administered 2021-02-13: 40 ug via INTRAVENOUS

## 2021-02-13 MED ORDER — ONDANSETRON HCL 4 MG/2ML IJ SOLN: 4.0000 mg | Freq: Four times a day (QID) | INTRAMUSCULAR | Status: DC | PRN

## 2021-02-13 MED ORDER — LIDOCAINE HCL (PF) 2 % IJ SOLN
INTRAMUSCULAR | Status: AC
  Filled 2021-02-13: qty 5

## 2021-02-13 MED ORDER — DIPHENHYDRAMINE HCL 50 MG/ML IJ SOLN: 12.5000 mg | Freq: Four times a day (QID) | INTRAMUSCULAR | Status: DC | PRN

## 2021-02-13 MED ORDER — MIDAZOLAM HCL 2 MG/2ML IJ SOLN
INTRAMUSCULAR | Status: AC
  Filled 2021-02-13: qty 2

## 2021-02-13 MED ORDER — ACETAMINOPHEN 325 MG RE SUPP: 650.0000 mg | Freq: Four times a day (QID) | RECTAL | Status: DC | PRN

## 2021-02-13 MED ORDER — LIDOCAINE HCL (CARDIAC) PF 100 MG/5ML IV SOSY
PREFILLED_SYRINGE | INTRAVENOUS | Status: DC | PRN
  Administered 2021-02-13: 60 mg via INTRAVENOUS

## 2021-02-13 MED ORDER — AMLODIPINE BESYLATE 5 MG PO TABS
5.0000 mg | ORAL_TABLET | Freq: Every day | ORAL | Status: DC
  Filled 2021-02-13: qty 1

## 2021-02-13 MED ORDER — CEFAZOLIN SODIUM-DEXTROSE 2-4 GM/100ML-% IV SOLN
INTRAVENOUS | Status: AC
  Filled 2021-02-13: qty 100

## 2021-02-13 MED ORDER — LACTATED RINGERS IV SOLN: INTRAVENOUS | Status: DC

## 2021-02-13 MED ORDER — SODIUM CHLORIDE (PF) 0.9 % IJ SOLN
INTRAMUSCULAR | Status: AC
  Filled 2021-02-13: qty 10

## 2021-02-13 MED ORDER — OXYCODONE HCL 5 MG PO TABS: 5.0000 mg | ORAL_TABLET | Freq: Once | ORAL | Status: DC | PRN

## 2021-02-13 MED ORDER — ALBUMIN HUMAN 5 % IV SOLN
12.5000 g | Freq: Once | INTRAVENOUS | Status: AC
  Administered 2021-02-13: 12.5 g via INTRAVENOUS

## 2021-02-13 MED ORDER — FENTANYL CITRATE (PF) 100 MCG/2ML IJ SOLN
INTRAMUSCULAR | Status: DC | PRN
  Administered 2021-02-13 (×2): 25 ug via INTRAVENOUS

## 2021-02-13 MED ORDER — ACETAMINOPHEN 500 MG PO TABS
1000.0000 mg | ORAL_TABLET | ORAL | Status: AC
  Administered 2021-02-13: 1000 mg via ORAL

## 2021-02-13 MED ORDER — TRAMADOL HCL 50 MG PO TABS: 50.0000 mg | ORAL_TABLET | Freq: Four times a day (QID) | ORAL | Status: DC | PRN

## 2021-02-13 MED ORDER — AMISULPRIDE (ANTIEMETIC) 5 MG/2ML IV SOLN: 10.0000 mg | Freq: Once | INTRAVENOUS | Status: DC | PRN

## 2021-02-13 MED ORDER — DEXAMETHASONE SODIUM PHOSPHATE 10 MG/ML IJ SOLN
INTRAMUSCULAR | Status: DC | PRN
  Administered 2021-02-13: 5 mg via INTRAVENOUS

## 2021-02-13 MED ORDER — PROPOFOL 10 MG/ML IV BOLUS
INTRAVENOUS | Status: DC | PRN
  Administered 2021-02-13: 200 mg via INTRAVENOUS

## 2021-02-13 MED ORDER — ONDANSETRON HCL 4 MG/2ML IJ SOLN
INTRAMUSCULAR | Status: DC | PRN
  Administered 2021-02-13: 4 mg via INTRAVENOUS

## 2021-02-13 MED ORDER — ACETAMINOPHEN 500 MG PO TABS
ORAL_TABLET | ORAL | Status: AC
  Filled 2021-02-13: qty 2

## 2021-02-13 MED ORDER — OXYCODONE HCL 5 MG PO TABS
5.0000 mg | ORAL_TABLET | ORAL | Status: DC | PRN
  Administered 2021-02-13: 5 mg via ORAL
  Filled 2021-02-13: qty 1

## 2021-02-13 MED ORDER — ROPIVACAINE HCL 5 MG/ML IJ SOLN
INTRAMUSCULAR | Status: DC | PRN
  Administered 2021-02-13: 30 mL via PERINEURAL

## 2021-02-13 MED ORDER — METHYLENE BLUE 0.5 % INJ SOLN
INTRAVENOUS | Status: AC
  Filled 2021-02-13: qty 10

## 2021-02-13 MED ORDER — KETOROLAC TROMETHAMINE 15 MG/ML IJ SOLN
15.0000 mg | Freq: Four times a day (QID) | INTRAMUSCULAR | Status: DC
  Administered 2021-02-13 – 2021-02-14 (×3): 15 mg via INTRAVENOUS
  Filled 2021-02-13 (×3): qty 1

## 2021-02-13 MED ORDER — PHENYLEPHRINE 40 MCG/ML (10ML) SYRINGE FOR IV PUSH (FOR BLOOD PRESSURE SUPPORT)
PREFILLED_SYRINGE | INTRAVENOUS | Status: AC
  Filled 2021-02-13: qty 10

## 2021-02-13 MED ORDER — MORPHINE SULFATE (PF) 4 MG/ML IV SOLN: 2.0000 mg | INTRAVENOUS | Status: DC | PRN

## 2021-02-13 MED ORDER — MIDAZOLAM HCL 2 MG/2ML IJ SOLN
2.0000 mg | Freq: Once | INTRAMUSCULAR | Status: AC
  Administered 2021-02-13: 2 mg via INTRAVENOUS

## 2021-02-13 MED ORDER — FENTANYL CITRATE (PF) 100 MCG/2ML IJ SOLN
100.0000 ug | Freq: Once | INTRAMUSCULAR | Status: AC
  Administered 2021-02-13: 50 ug via INTRAVENOUS

## 2021-02-13 SURGICAL SUPPLY — 48 items
APPLIER CLIP 9.375 MED OPEN (MISCELLANEOUS) ×2
BENZOIN TINCTURE PRP APPL 2/3 (GAUZE/BANDAGES/DRESSINGS) ×2 IMPLANT
BINDER BREAST XLRG (GAUZE/BANDAGES/DRESSINGS) ×2 IMPLANT
BIOPATCH RED 1 DISK 7.0 (GAUZE/BANDAGES/DRESSINGS) ×2 IMPLANT
BLADE SURG 10 STRL SS (BLADE) ×2 IMPLANT
BLADE SURG 15 STRL LF DISP TIS (BLADE) ×1 IMPLANT
BLADE SURG 15 STRL SS (BLADE) ×1
CANISTER SUCT 1200ML W/VALVE (MISCELLANEOUS) ×2 IMPLANT
CHLORAPREP W/TINT 26 (MISCELLANEOUS) ×2 IMPLANT
CLIP APPLIE 9.375 MED OPEN (MISCELLANEOUS) ×1 IMPLANT
COVER BACK TABLE 60X90IN (DRAPES) ×2 IMPLANT
COVER MAYO STAND STRL (DRAPES) ×2 IMPLANT
COVER PROBE W GEL 5X96 (DRAPES) ×2 IMPLANT
DRAIN CHANNEL 19F RND (DRAIN) ×2 IMPLANT
DRAPE LAPAROSCOPIC ABDOMINAL (DRAPES) ×2 IMPLANT
DRAPE UTILITY XL STRL (DRAPES) ×2 IMPLANT
DRSG TEGADERM 2-3/8X2-3/4 SM (GAUZE/BANDAGES/DRESSINGS) ×2 IMPLANT
ELECT BLADE 4.0 EZ CLEAN MEGAD (MISCELLANEOUS) ×2
ELECT REM PT RETURN 9FT ADLT (ELECTROSURGICAL) ×2
ELECTRODE BLDE 4.0 EZ CLN MEGD (MISCELLANEOUS) ×1 IMPLANT
ELECTRODE REM PT RTRN 9FT ADLT (ELECTROSURGICAL) ×1 IMPLANT
EVACUATOR SILICONE 100CC (DRAIN) ×2 IMPLANT
GAUZE SPONGE 4X4 12PLY STRL (GAUZE/BANDAGES/DRESSINGS) ×2 IMPLANT
GLOVE SURG ENC MOIS LTX SZ6.5 (GLOVE) ×4 IMPLANT
GLOVE SURG ENC MOIS LTX SZ7 (GLOVE) ×4 IMPLANT
GLOVE SURG UNDER POLY LF SZ7 (GLOVE) ×6 IMPLANT
GLOVE SURG UNDER POLY LF SZ7.5 (GLOVE) ×2 IMPLANT
GOWN STRL REUS W/ TWL LRG LVL3 (GOWN DISPOSABLE) ×4 IMPLANT
GOWN STRL REUS W/TWL LRG LVL3 (GOWN DISPOSABLE) ×4
HEMOSTAT ARISTA ABSORB 3G PWDR (HEMOSTASIS) ×4 IMPLANT
NDL SAFETY ECLIPSE 18X1.5 (NEEDLE) ×1 IMPLANT
NEEDLE HYPO 18GX1.5 SHARP (NEEDLE) ×1
NEEDLE HYPO 25X1 1.5 SAFETY (NEEDLE) ×4 IMPLANT
NS IRRIG 1000ML POUR BTL (IV SOLUTION) ×2 IMPLANT
PACK BASIN DAY SURGERY FS (CUSTOM PROCEDURE TRAY) ×2 IMPLANT
PENCIL SMOKE EVACUATOR (MISCELLANEOUS) ×2 IMPLANT
PIN SAFETY STERILE (MISCELLANEOUS) ×2 IMPLANT
SLEEVE SCD COMPRESS KNEE MED (STOCKING) ×2 IMPLANT
SPONGE T-LAP 18X18 ~~LOC~~+RFID (SPONGE) ×4 IMPLANT
STRIP CLOSURE SKIN 1/2X4 (GAUZE/BANDAGES/DRESSINGS) ×2 IMPLANT
SUT ETHILON 2 0 FS 18 (SUTURE) ×2 IMPLANT
SUT MON AB 4-0 PC3 18 (SUTURE) ×2 IMPLANT
SUT SILK 2 0 SH (SUTURE) ×2 IMPLANT
SUT VICRYL 3-0 CR8 SH (SUTURE) ×2 IMPLANT
SYR CONTROL 10ML LL (SYRINGE) ×4 IMPLANT
TOWEL GREEN STERILE FF (TOWEL DISPOSABLE) ×4 IMPLANT
TUBE CONNECTING 20X1/4 (TUBING) ×2 IMPLANT
YANKAUER SUCT BULB TIP NO VENT (SUCTIONS) ×2 IMPLANT

## 2021-02-13 NOTE — Progress Notes (Signed)
Assisted Dr. Miller with right, ultrasound guided, pectoralis block. Side rails up, monitors on throughout procedure. See vital signs in flow sheet. Tolerated Procedure well. 

## 2021-02-13 NOTE — Discharge Instructions (Signed)
[facility] [phone]  MASTECTOMY: POST OP INSTRUCTIONS  Always review your discharge instruction sheet given to you by the facility where your surgery was performed. IF YOU HAVE DISABILITY OR FAMILY LEAVE FORMS, YOU MUST BRING THEM TO THE OFFICE FOR PROCESSING.   DO NOT GIVE THEM TO YOUR DOCTOR. A prescription for pain medication may be given to you upon discharge.  Take your pain medication as prescribed, if needed.  If narcotic pain medicine is not needed, then you may take acetaminophen (Tylenol) or ibuprofen (Advil) as needed. Take your usually prescribed medications unless otherwise directed. If you need a refill on your pain medication, please contact your pharmacy.  They will contact our office to request authorization.  Prescriptions will not be filled after 5pm or on week-ends. You should follow a light diet the first few days after arrival home, such as soup and crackers, etc.  Resume your normal diet the day after surgery. Most patients will experience some swelling and bruising on the chest and underarm.  Ice packs will help.  Swelling and bruising can take several days to resolve.  It is common to experience some constipation if taking pain medication after surgery.  Increasing fluid intake and taking a stool softener (such as Colace) will usually help or prevent this problem from occurring.  A mild laxative (Milk of Magnesia or Miralax) should be taken according to package instructions if there are no bowel movements after 48 hours. Unless discharge instructions indicate otherwise, leave your bandage dry and in place until your next appointment in 3-5 days.  You may take a limited sponge bath.  No tube baths or showers until the drains are removed.  You may have steri-strips (small skin tapes) in place directly over the incision.  These strips should be left on the skin for 7-10 days.  If your surgeon used skin glue on the incision, you may shower in 24 hours.   The glue will flake off over the next 2-3 weeks.  Any sutures or staples will be removed at the office during your follow-up visit. DRAINS:  If you have drains in place, it is important to keep a list of the amount of drainage produced each day in your drains.  Before leaving the hospital, you should be instructed on drain care.  Call our office if you have any questions about your drains. ACTIVITIES:  You may resume regular (light) daily activities beginning the next day--such as daily self-care, walking, climbing stairs--gradually increasing activities as tolerated.  You may have sexual intercourse when it is comfortable.  Refrain from any heavy lifting or straining until approved by your doctor. You may drive when you are no longer taking prescription pain medication, you can comfortably wear a seatbelt, and you can safely maneuver your car and apply brakes. RETURN TO WORK:  __________________________________________________________ You should see your doctor in the office for a follow-up appointment approximately 3-5 days after your surgery.  Your doctor's nurse will typically make your follow-up appointment when she calls you with your pathology report.  Expect your pathology report 2-3 business days after your surgery.  You may call to check if you do not hear from us after three days.   OTHER INSTRUCTIONS: ______________________________________________________________________________________________ ____________________________________________________________________________________________ WHEN TO CALL YOUR DOCTOR: Fever over 101.0 Nausea and/or vomiting Extreme swelling or bruising Continued bleeding from incision. Increased pain, redness, or drainage from the incision. The clinic staff is available to answer your questions during regular business hours.  Please don't hesitate   to call and ask to speak to one of the nurses for clinical concerns.  If you have a medical emergency, go to the  nearest emergency room or call 911.  A surgeon from [facility] is always on call at the hospital. [address], [address], [place], [place]  [address] ? [address] [address], [place], [place]   [address] [phone] ? [phone] ? FAX [phone] Web site: [email or web address]  

## 2021-02-13 NOTE — Anesthesia Postprocedure Evaluation (Signed)
Anesthesia Post Note  Patient: Victoria Huang  Procedure(s) Performed: RIGHT MASTECTOMY WITH SENTINEL LYMPH NODE BIOPSY (Right: Breast)     Patient location during evaluation: PACU Anesthesia Type: General Level of consciousness: awake and alert Pain management: pain level controlled Vital Signs Assessment: post-procedure vital signs reviewed and stable Respiratory status: spontaneous breathing, nonlabored ventilation and respiratory function stable Cardiovascular status: blood pressure returned to baseline and stable Postop Assessment: no apparent nausea or vomiting Anesthetic complications: no   No notable events documented.  Last Vitals:  Vitals:   02/13/21 1410 02/13/21 1415  BP:  117/71  Pulse:  62  Resp:  (!) 9  Temp:    SpO2: 92% 98%    Last Pain:  Vitals:   02/13/21 1415  TempSrc:   PainSc: 0-No pain                 Lynda Rainwater

## 2021-02-13 NOTE — Anesthesia Procedure Notes (Signed)
Anesthesia Regional Block: Pectoralis block   Pre-Anesthetic Checklist: , timeout performed,  Correct Patient, Correct Site, Correct Laterality,  Correct Procedure, Correct Position, site marked,  Risks and benefits discussed,  Surgical consent,  Pre-op evaluation,  At surgeon's request and post-op pain management  Laterality: Right  Prep: chloraprep       Needles:  Injection technique: Single-shot  Needle Type: Stimiplex     Needle Length: 9cm  Needle Gauge: 21     Additional Needles:   Procedures:,,,, ultrasound used (permanent image in chart),,    Narrative:  Start time: 02/13/2021 9:16 AM End time: 02/13/2021 9:21 AM Injection made incrementally with aspirations every 5 mL.  Performed by: Personally  Anesthesiologist: Lynda Rainwater, MD

## 2021-02-13 NOTE — Interval H&P Note (Signed)
History and Physical Interval Note:  02/13/2021 8:30 AM  Victoria Huang  has presented today for surgery, with the diagnosis of RIGHT BREAST DUCTAL CARCINOMA IN SITU.  The various methods of treatment have been discussed with the patient and family. After consideration of risks, benefits and other options for treatment, the patient has consented to  Procedure(s): RIGHT MASTECTOMY WITH SENTINEL LYMPH NODE BIOPSY (Right) as a surgical intervention.  The patient's history has been reviewed, patient examined, no change in status, stable for surgery.  I have reviewed the patient's chart and labs.  Questions were answered to the patient's satisfaction.     [name]

## 2021-02-13 NOTE — Anesthesia Preprocedure Evaluation (Signed)
Anesthesia Evaluation  Patient identified by MRN, date of birth, ID band Patient awake    Reviewed: Allergy & Precautions, NPO status , Patient's Chart, lab work & pertinent test results  Airway Mallampati: II  TM Distance: >3 FB Neck ROM: Full    Dental no notable dental hx.    Pulmonary neg pulmonary ROS,    Pulmonary exam normal breath sounds clear to auscultation       Cardiovascular hypertension, Pt. on medications negative cardio ROS Normal cardiovascular exam Rhythm:Regular Rate:Normal     Neuro/Psych negative neurological ROS  negative psych ROS   GI/Hepatic negative GI ROS, Neg liver ROS,   Endo/Other  negative endocrine ROS  Renal/GU negative Renal ROS  negative genitourinary   Musculoskeletal negative musculoskeletal ROS (+)   Abdominal   Peds negative pediatric ROS (+)  Hematology negative hematology ROS (+)   Anesthesia Other Findings Breast Cancer  Reproductive/Obstetrics negative OB ROS                             Anesthesia Physical Anesthesia Plan  ASA: 3  Anesthesia Plan: General   Post-op Pain Management:    Induction: Intravenous  PONV Risk Score and Plan: 3 and Ondansetron, Dexamethasone, Midazolam and Treatment may vary due to age or medical condition  Airway Management Planned: LMA  Additional Equipment:   Intra-op Plan:   Post-operative Plan: Extubation in OR  Informed Consent: I have reviewed the patients History and Physical, chart, labs and discussed the procedure including the risks, benefits and alternatives for the proposed anesthesia with the patient or authorized representative who has indicated his/her understanding and acceptance.     Dental advisory given  Plan Discussed with: CRNA  Anesthesia Plan Comments:         Anesthesia Quick Evaluation

## 2021-02-13 NOTE — Progress Notes (Signed)
Nuc med injections completed. Patient tolerated well.   

## 2021-02-13 NOTE — Transfer of Care (Signed)
Immediate Anesthesia Transfer of Care Note  Patient: Victoria Huang  Procedure(s) Performed: RIGHT MASTECTOMY WITH SENTINEL LYMPH NODE BIOPSY (Right: Breast)  Patient Location: PACU  Anesthesia Type:GA combined with regional for post-op pain  Level of Consciousness: drowsy  Airway & Oxygen Therapy: Patient Spontanous Breathing and Patient connected to face mask oxygen  Post-op Assessment: Report given to RN and Post -op Vital signs reviewed and stable  Post vital signs: Reviewed and stable  Last Vitals:  Vitals Value Taken Time  BP 181/91 02/13/21 1252  Temp    Pulse 74 02/13/21 1254  Resp 12 02/13/21 1254  SpO2 99 % 02/13/21 1254  Vitals shown include unvalidated device data.  Last Pain:  Vitals:   02/13/21 0829  TempSrc: Oral  PainSc: 3       Patients Stated Pain Goal: 3 (99/69/24 9324)  Complications: No notable events documented.

## 2021-02-13 NOTE — Anesthesia Procedure Notes (Signed)
Procedure Name: LMA Insertion Date/Time: 02/13/2021 11:07 AM Performed by: Lavonia Dana, CRNA Pre-anesthesia Checklist: Patient identified, Emergency Drugs available, Suction available and Patient being monitored Patient Re-evaluated:Patient Re-evaluated prior to induction Oxygen Delivery Method: Circle system utilized Preoxygenation: Pre-oxygenation with 100% oxygen Induction Type: IV induction Ventilation: Mask ventilation without difficulty LMA: LMA inserted LMA Size: 4.0 Number of attempts: 1 Airway Equipment and Method: Bite block Placement Confirmation: positive ETCO2 Tube secured with: Tape Dental Injury: Teeth and Oropharynx as per pre-operative assessment

## 2021-02-13 NOTE — Op Note (Signed)
Pre-op Diagnosis: Ductal carcinoma in situ right breast Post-op Diagnosis: same Procedure: Right mastectomy with sentinel lymph node biopsy with blue dye injection Surgeon:  Kalonji Zurawski K. Anesthesia:  GEN - LMA/ PEC block Indications:  This is a 75 year old female who presents after a recent screening mammogram revealed right breast asymmetry and calcifications.  She was found to have right lower inner quadrant calcifications spanning 9.1 x 2.9 x 1.9 centimeters.  She has at least three other areas of concern, the largest in the right lower inner quadrant measuring 2.5 x 0.8 x 0.4 cm.  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  ER/PR positive. Description of procedure: The patient is brought to the operating room placed in supine position on the operating room table. After an adequate level of general anesthesia was obtained, we injected methylene blue dye solution into the dermis around her nipple.  Her right breast was prepped with ChloraPrep and draped in sterile fashion. A timeout was taken to ensure the proper patient and proper procedure.   I outlined an elliptical incision to include the entire nipple areolar complex.  We made our upper incision with a scalpel.  We dissected into the breast tissue with cautery.  We raised flaps superiorly to the chest wall below the clavicle.  Medially we dissected to the edge of the sternum.  We made our lower incision and we dissected down to the inframammary crease.  Laterally we dissected to the anterior edge of the latissimus muscle.  We then dissected the breast tissue off of the underlying pectoralis muscle taking the anterior layer of fascia with the specimen.  We dissected laterally until we encountered the axilla.  We then turned our attention to the axilla.  The settings were adjusted on the Neoprobe and we interrogated the axilla.  We dissected into the axilla and identified a radioactive lymph node.  Some  blue dye was seen within the node.  This was dissected free and sent as "sentinel lymph node #1".  We interrogated the axilla again and there was minimal background activity.  We completed our mastectomy by dissecting the breast tissue off of the chest wall.  The specimen was marked with a long suture lateral and a short suture superior.  This was sent for pathologic examination.  We irrigated the wound and inspected carefully for hemostasis.  We sprayed Arista powder throughout the surgical field.  A 19 French drain was placed through a stab incision laterally.  This was secured with 2-0 nylon.  The wounds were closed with a deep layer of 3-0 Vicryl and a subcuticular layer of 4-0 Monocryl. Benzoin and Steri-Strips were applied. The patient was then extubated and brought to the recovery room in stable condition. All sponge, instrument, and needle counts are correct.  Imogene Burn. Georgette Dover, MD, [facility]  General/ Trauma Surgery  06/25/2020 1:38 PM

## 2021-02-14 ENCOUNTER — Encounter (HOSPITAL_BASED_OUTPATIENT_CLINIC_OR_DEPARTMENT_OTHER): Payer: Self-pay | Admitting: Surgery

## 2021-02-14 DIAGNOSIS — D0511 Intraductal carcinoma in situ of right breast: Secondary | ICD-10-CM | POA: Diagnosis not present

## 2021-02-14 MED ORDER — OXYCODONE HCL 5 MG PO TABS: 5.0000 mg | ORAL_TABLET | Freq: Four times a day (QID) | ORAL | 0 refills | Status: DC | PRN

## 2021-02-14 NOTE — Discharge Summary (Signed)
Physician Discharge Summary  Patient ID: Victoria Huang MRN: [id number] DOB/AGE: 1945/09/19 75 y.o.  Admit date: 02/13/2021 Discharge date: 02/14/2021  Admission Diagnoses:  Right breast ductal carcinoma in situ  Discharge Diagnoses: Same Active Problems:   Breast neoplasm, Tis (DCIS), right   Discharged Condition: good  Hospital Course: Right mastectomy/ SLNB on 02/13/21.  She did well overnight.  Pain well-controlled   Treatments: surgery: right mastectomy/ SLNB  Discharge Exam: Blood pressure (!) 152/67, pulse 80, temperature 98.4 F (36.9 C), resp. rate 16, height 5\' 3"  (1.6 m), weight 70.1 kg, SpO2 97 %. Skin flaps viable; no drainage from incision; drain in JP bulb - serosanguinous; no hematoma  Disposition: Discharge disposition: 01-Home or Self Care      Discharge Instructions     Call MD for:  persistant nausea and vomiting   Complete by: As directed    Call MD for:  redness, tenderness, or signs of infection (pain, swelling, redness, odor or green/yellow discharge around incision site)   Complete by: As directed    Call MD for:  severe uncontrolled pain   Complete by: As directed    Call MD for:  temperature >100.4   Complete by: As directed    Diet general   Complete by: As directed    Driving Restrictions   Complete by: As directed    Do not drive while taking pain medications   Increase activity slowly   Complete by: As directed    May shower / Bathe   Complete by: As directed       Allergies as of 02/14/2021       Reactions   Other    Kiwi tea causes dizziness        Medication List     TAKE these medications    amLODipine 5 MG tablet Commonly known as: NORVASC Take 1 tablet by mouth at bedtime.   oxyCODONE 5 MG immediate release tablet Commonly known as: Oxy IR/ROXICODONE Take 1 tablet (5 mg total) by mouth every 6 (six) hours as needed for severe pain.   rosuvastatin 20 MG tablet Commonly known as: CRESTOR Take 20 mg by mouth  at bedtime.        Follow-up Information     Donnie Mesa, MD Follow up in 1 week(s).   Specialty: General Surgery Contact information: [address] [place]  [id number] [phone]                 Signed: [name] 02/14/2021, 8:06 AM

## 2021-02-15 LAB — SURGICAL PATHOLOGY

## 2021-02-18 ENCOUNTER — Encounter: Payer: Self-pay | Admitting: *Deleted

## 2021-02-22 ENCOUNTER — Ambulatory Visit: Payer: Medicare Other | Admitting: Hematology and Oncology

## 2021-02-22 NOTE — Assessment & Plan Note (Deleted)
02/13/2021: Right mastectomy Dr. Georgette Dover: Intermediate to high-grade DCIS with necrosis and calcifications spanning 1.8 cm, margins negative, 0/1 lymph node negative  Pathology counseling: I discussed the final pathology report of the patient provided  a copy of this report. I discussed the margins.  We also discussed the final staging along with previously performed ER/PR testing.  Treatment plan: Adjuvant antiestrogen therapy with tamoxifen x5 years Tamoxifen counseling: We discussed the risks and benefits of tamoxifen. These include but not limited to insomnia, hot flashes, mood changes, vaginal dryness, and weight gain. Although rare, serious side effects including endometrial cancer, risk of blood clots were also discussed. We strongly believe that the benefits far outweigh the risks. Patient understands these risks and consented to starting treatment. Planned treatment duration is 5 years.  Return to clinic in 3 months for survivorship care plan visit

## 2021-02-25 ENCOUNTER — Encounter: Payer: Self-pay | Admitting: *Deleted

## 2021-02-26 ENCOUNTER — Telehealth: Payer: Self-pay | Admitting: Hematology and Oncology

## 2021-02-26 ENCOUNTER — Encounter: Payer: Self-pay | Admitting: *Deleted

## 2021-02-26 NOTE — Telephone Encounter (Signed)
R/s appt per 7/25 sch msg. Pt aware.

## 2021-03-07 ENCOUNTER — Encounter: Payer: Self-pay | Admitting: Physical Therapy

## 2021-03-07 ENCOUNTER — Ambulatory Visit: Payer: Medicare Other | Attending: Surgery | Admitting: Physical Therapy

## 2021-03-07 ENCOUNTER — Other Ambulatory Visit: Payer: Self-pay

## 2021-03-07 DIAGNOSIS — R293 Abnormal posture: Secondary | ICD-10-CM | POA: Diagnosis present

## 2021-03-07 DIAGNOSIS — Z483 Aftercare following surgery for neoplasm: Secondary | ICD-10-CM | POA: Diagnosis present

## 2021-03-07 DIAGNOSIS — Z170 Estrogen receptor positive status [ER+]: Secondary | ICD-10-CM | POA: Insufficient documentation

## 2021-03-07 DIAGNOSIS — M25611 Stiffness of right shoulder, not elsewhere classified: Secondary | ICD-10-CM | POA: Insufficient documentation

## 2021-03-07 DIAGNOSIS — C50311 Malignant neoplasm of lower-inner quadrant of right female breast: Secondary | ICD-10-CM | POA: Diagnosis present

## 2021-03-07 NOTE — Therapy (Addendum)
[place], [place], [address] Phone: [phone]   Fax:  [phone]  Physical Therapy Treatment  Patient Details  Name: Victoria Huang MRN: [id number] Date of Birth: 1946/04/27 Referring Provider (PT): Dr. Donnie Mesa   Encounter Date: 03/07/2021   PT End of Session - 03/07/21 1001     Visit Number 2    Number of Visits 10    Date for PT Re-Evaluation 04/04/21    PT Start Time 0904    PT Stop Time [id number]    PT Time Calculation (min) 58 min    Activity Tolerance Patient tolerated treatment well;Patient limited by pain    Behavior During Therapy [facility] for tasks assessed/performed             Past Medical History:  Diagnosis Date   Breast cancer ([place])    Family history of breast cancer 01/23/2021   Family history of prostate cancer 01/23/2021   High cholesterol    Hypertension     Past Surgical History:  Procedure Laterality Date   MASTECTOMY W/ SENTINEL NODE BIOPSY Right 02/13/2021   Procedure: RIGHT MASTECTOMY WITH SENTINEL LYMPH NODE BIOPSY;  Surgeon: Donnie Mesa, MD;  Location: [facility];  Service: General;  Laterality: Right;    There were no vitals filed for this visit.   Subjective Assessment - 03/07/21 0911     Subjective Patient reports she had a right mastectomy and sentinel node biopsy (1 negative node) on 02/13/2021. She began having symptoms of an infection on 02/27/2021 and she began antibiotics but had to stop due to nausea and because her surgeon who saw her a few days later didn't see signs of an infection. She will not need further treatment ecept possibly anti-estrogen therapy.    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 She had a right mastectomy and sentinel node biopsy (1 negative node) on 02/13/2021.    Patient Stated Goals Get my arm back to normal    Currently in Pain? Yes     Pain Score 7     Pain Location Chest    Pain Orientation Right;Mid    Pain Descriptors / Indicators Aching    Pain Type Surgical pain    Pain Onset 1 to 4 weeks ago    Pain Frequency Intermittent    Aggravating Factors  Getting up from sitting    Pain Relieving Factors Resting                [facility] PT Assessment - 03/07/21 0001       Assessment   Medical Diagnosis s/p right mastectomy and SLNB    Referring Provider (PT) Dr. Donnie Mesa    Onset Date/Surgical Date 02/13/21    Hand Dominance Right    Prior Therapy Baselines      Precautions   Precautions Other (comment)    Precaution Comments recent surgery      Restrictions   Weight Bearing Restrictions No      Balance Screen   Has the patient fallen in the past 6 months No    Has the patient had a decrease in activity level because of a fear of falling?  No    Is the patient reluctant to leave their home because of a fear of falling?  No      Home Social worker Private residence    Living Arrangements Alone  Available Help at Discharge Family      Prior Function   Level of Independence Independent    Vocation Full time employment    Vocation Requirements Not currently working    Leisure Not exercising      Cognition   Overall Cognitive Status Within Functional Limits for tasks assessed      Observation/Other Assessments   Observations Right chest incision appears to be healig well but she has a significant amount of edema present in her chest. Difficult to determine if it is all edema or if thee is tissue present. Compression foam inside stockinette applied to area between bra and skin.      Posture/Postural Control   Posture/Postural Control Postural limitations    Postural Limitations Rounded Shoulders;Forward head      ROM / Strength   AROM / PROM / Strength AROM      AROM   AROM Assessment Site Shoulder    Right/Left Shoulder Right    Right Shoulder Extension 40 Degrees     Right Shoulder Flexion 83 Degrees    Right Shoulder ABduction 70 Degrees               LYMPHEDEMA/ONCOLOGY QUESTIONNAIRE - 03/07/21 0001       Type   Cancer Type Right breast cancer      Surgeries   Mastectomy Date 02/13/21    Sentinel Lymph Node Biopsy Date 02/13/21    Number Lymph Nodes Removed 1      Treatment   Active Chemotherapy Treatment No    Past Chemotherapy Treatment No    Active Radiation Treatment No    Past Radiation Treatment No    Current Hormone Treatment No    Past Hormone Therapy No      What other symptoms do you have   Are you Having Heaviness or Tightness Yes    Are you having Pain Yes    Are you having pitting edema No    Is it Hard or Difficult finding clothes that fit No    Do you have infections Yes    Comments 1    Is there Decreased scar mobility Yes    Stemmer Sign No      Lymphedema Assessments   Lymphedema Assessments Upper extremities      Right Upper Extremity Lymphedema   10 cm Proximal to Olecranon Process 29.5 cm    Olecranon Process 25.8 cm    10 cm Proximal to Ulnar Styloid Process 21.4 cm    Just Proximal to Ulnar Styloid Process 15.9 cm    Across Hand at [employer] 19.6 cm    At [place] of 2nd Digit 6.3 cm      Left Upper Extremity Lymphedema   10 cm Proximal to Olecranon Process 29.2 cm    Olecranon Process 25.7 cm    10 cm Proximal to Ulnar Styloid Process 21.5 cm    Just Proximal to Ulnar Styloid Process 16.2 cm    Across Hand at [employer] 20 cm    At [place] of 2nd Digit 6.7 cm                Quick Dash - 03/07/21 0001     Open a tight or new jar Mild difficulty    Do heavy household chores (wash walls, wash floors) Mild difficulty    Carry a shopping bag or briefcase No difficulty    Wash your back Mild difficulty    Use a knife  to cut food Mild difficulty    Recreational activities in which you take some force or impact through your arm, shoulder, or hand (golf, hammering, tennis) Moderate  difficulty    During the past week, to what extent has your arm, shoulder or hand problem interfered with your normal social activities with family, friends, neighbors, or groups? Modererately    During the past week, to what extent has your arm, shoulder or hand problem limited your work or other regular daily activities Slightly    Arm, shoulder, or hand pain. Mild    Tingling (pins and needles) in your arm, shoulder, or hand Mild    Difficulty Sleeping Mild difficulty    DASH Score 27.27 %                            PT Education - 03/07/21 0952     Education Details Lymphedema risk reduction and HEP; aftercare    Person(s) Educated Patient;Child(ren)    Methods Explanation;Demonstration;Handout    Comprehension Returned demonstration;Verbalized understanding                 PT Long Term Goals - 03/07/21 1005       PT LONG TERM GOAL #1   Title Patient will demonstrate she has regained full shoulder ROM and function post operatively compared to baselines.    Time 4    Period Weeks    Status On-going    Target Date 04/04/21      PT LONG TERM GOAL #2   Title Patient will report pain decreased to </= 4/10 with most daily tasks.    Baseline 7/10    Time 4    Period Weeks    Status New    Target Date 04/04/21      PT LONG TERM GOAL #3   Title Patient will increase right shoulder active flexion to >/=120 degrees for increased ease reaching overhead.    Baseline 83 post op; 144 pre-op    Time 4    Period Weeks    Status New    Target Date 04/04/21      PT LONG TERM GOAL #4   Title Patient will increase right shoulder active abduction to >/= 120 degrees for increased ease reaching, dressing, and performing kitchen tasks.    Baseline 70 post op; 150 pre op    Time 4    Period Weeks    Status New    Target Date 04/04/21      PT LONG TERM GOAL #5   Title Patient will verbalize good understanding of lymphedema risk reduction practices.    Time 4     Period Weeks    Status New    Target Date 04/04/21                   Plan - 03/07/21 1002     Clinical Impression Statement Patient is 3 weeks s/p right mastectomy and sentinel node biopsy on 02/13/2021. She had 1 negative node removed. She is limited today by pain and edema. She has not tried doing her HEP yet. She had a possible infection treated briefly with antibiotics but stopped due to nausea and after seeing her surgeon, he did not see signs of infection. Her surgery site does not have signs of infection present today. She will benefit from PT to regain shoulder ROM and function, return to work, and reduce pain.    PT Frequency  2x / week    PT Duration 4 weeks    PT Treatment/Interventions ADLs/Self Care Home Management;Therapeutic exercise;Patient/family education;Manual techniques;Manual lymph drainage;Passive range of motion;Scar mobilization    PT Next Visit Plan Begin PROM right shoulder; AAROM exercises    PT Home Exercise Plan Post op shoulder ROM HEP - reviewed all previously given HEP    Consulted and Agree with Plan of Care Patient;Family member/caregiver    Family Member Consulted Daughter             Patient will benefit from skilled therapeutic intervention in order to improve the following deficits and impairments:  Decreased knowledge of precautions, Postural dysfunction, Decreased range of motion, Impaired UE functional use, Pain, Decreased scar mobility, Increased edema  Visit Diagnosis: Malignant neoplasm of lower-inner quadrant of right 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, estrogen receptor positive ([place]) - Plan: PT plan of care cert/re-cert  Stiffness of right shoulder, not elsewhere classified - Plan: PT plan of care cert/re-cert  Aftercare following surgery for neoplasm - Plan: PT plan of care cert/re-cert     Problem List Patient Active Problem List   Diagnosis Date Noted   Breast neoplasm, Tis (DCIS), right 02/13/2021   Genetic testing 01/31/2021    Family history of breast cancer 01/23/2021   Family history of prostate cancer 01/23/2021   Ductal carcinoma in situ (DCIS) of right breast 01/17/2021   Annia Friendly, PT 03/07/21 11:27 AM   [place], [place], [address] Phone: [phone]   Fax:  [phone]  Name: Victoria Huang MRN: [id number] Date of Birth: [birth year]

## 2021-03-07 NOTE — Patient Instructions (Addendum)
            [facility]         [address], [place] [address]         [phone]         Annia Friendly, PT, CLT   After Breast Cancer Class It is recommended you attend the ABC class to be educated on lymphedema risk reduction. This class is free of charge and lasts for 1 hour. It is a 1-time class.  You are scheduled for August 15th at 11:00. We will send you a Webex link.  Scar massage We will begin this when you are ready but you aren't ready for that yet.  Compression garment You need to be wearing your compression bra and using the compression foam I gave you.  Home exercise Program Begin doing the exercises we went over today. You have the sheet with pictures and instructions.  Follow up PT: It is recommended you return every 3 months for the first 3 years following surgery to be assessed on the SOZO machine for an L-Dex score. This helps prevent clinically significant lymphedema in 95% of patients. These follow up screens are 10 minute appointments that you are not billed for. WE ARE SCHEDULED TO MOVE TO [place] May 06, 2021. APPOINTMENTS FOR SOZO SCREENS AFTER 05/06/2021 WILL BE LOCATED AT [facility] AT [address] [place] [address]. Please call us to confirm we have moved if your appointment is scheduled after October 3rd, 2022. The phone number is [phone]. You are scheduled for November 14th at 3:10.

## 2021-03-12 ENCOUNTER — Ambulatory Visit: Payer: Medicare Other

## 2021-03-12 ENCOUNTER — Other Ambulatory Visit: Payer: Self-pay

## 2021-03-12 DIAGNOSIS — C50311 Malignant neoplasm of lower-inner quadrant of right female breast: Secondary | ICD-10-CM | POA: Diagnosis not present

## 2021-03-12 DIAGNOSIS — Z483 Aftercare following surgery for neoplasm: Secondary | ICD-10-CM

## 2021-03-12 DIAGNOSIS — R293 Abnormal posture: Secondary | ICD-10-CM

## 2021-03-12 DIAGNOSIS — M25611 Stiffness of right shoulder, not elsewhere classified: Secondary | ICD-10-CM

## 2021-03-12 DIAGNOSIS — Z170 Estrogen receptor positive status [ER+]: Secondary | ICD-10-CM

## 2021-03-12 NOTE — Therapy (Signed)
[place], [place], [address] Phone: [phone]   Fax:  [phone]  Physical Therapy Treatment  Patient Details  Name: Victoria Huang MRN: [id number] Date of Birth: 1945-09-23 Referring Provider (PT): Dr. Donnie Mesa   Encounter Date: 03/12/2021   PT End of Session - 03/12/21 1350     Visit Number 3    Number of Visits 10    Date for PT Re-Evaluation 04/04/21    PT Start Time 1307    PT Stop Time [id number]    PT Time Calculation (min) 42 min    Activity Tolerance Patient tolerated treatment well;Patient limited by pain    Behavior During Therapy [facility] for tasks assessed/performed             Past Medical History:  Diagnosis Date   Breast cancer ([place])    Family history of breast cancer 01/23/2021   Family history of prostate cancer 01/23/2021   High cholesterol    Hypertension     Past Surgical History:  Procedure Laterality Date   MASTECTOMY W/ SENTINEL NODE BIOPSY Right 02/13/2021   Procedure: RIGHT MASTECTOMY WITH SENTINEL LYMPH NODE BIOPSY;  Surgeon: Donnie Mesa, MD;  Location: [facility];  Service: General;  Laterality: Right;    There were no vitals filed for this visit.   Subjective Assessment - 03/12/21 1306     Subjective Pt reports she has been wearing the foam 24/7 and it has helped alot.  It has taken out the soreness and swelling. She has been doing her shoulder ROM exs and believes her ROM is better.  Once in a while I get a little pain in my right chest when I rise from a chair and then it goes away.    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 She had a right mastectomy and sentinel node biopsy (1 negative node) on 02/13/2021.    Patient Stated Goals Get my arm back to normal    Currently in Pain? No/denies    Pain Score 0-No pain                                [facility] Adult PT Treatment/Exercise - 03/12/21 0001       Shoulder Exercises: Supine   Other Supine Exercises Supine AA shoulder flexion, and star gazer x 6      Manual Therapy   Soft tissue mobilization STM to right pectorals, lats, Scapular area in supine and SL    Passive ROM Right shoulder PROM flex, scaption, abd, ER with multiple VC's to relax                         PT Long Term Goals - 03/07/21 1005       PT LONG TERM GOAL #1   Title Patient will demonstrate she has regained full shoulder ROM and function post operatively compared to baselines.    Time 4    Period Weeks    Status On-going    Target Date 04/04/21      PT LONG TERM GOAL #2   Title Patient will report pain decreased to </= 4/10 with most daily tasks.    Baseline 7/10    Time 4    Period Weeks    Status New    Target Date 04/04/21  PT LONG TERM GOAL #3   Title Patient will increase right shoulder active flexion to >/=120 degrees for increased ease reaching overhead.    Baseline 83 post op; 144 pre-op    Time 4    Period Weeks    Status New    Target Date 04/04/21      PT LONG TERM GOAL #4   Title Patient will increase right shoulder active abduction to >/= 120 degrees for increased ease reaching, dressing, and performing kitchen tasks.    Baseline 70 post op; 150 pre op    Time 4    Period Weeks    Status New    Target Date 04/04/21      PT LONG TERM GOAL #5   Title Patient will verbalize good understanding of lymphedema risk reduction practices.    Time 4    Period Weeks    Status New    Target Date 04/04/21                   Plan - 03/12/21 1351     Clinical Impression Statement Pt has been compliant with wearing foam in her bra and it has helped the swelling and tenderness.  therapy consisted of STM to right pectorals, lats and scapular area, and AAROM/PROM to right shoulder to restore functional mobility.  Pt requires multiple VC's and TC's to relax but  improved with repetition.    Stability/Clinical Decision Making Stable/Uncomplicated    Rehab Potential Excellent    PT Frequency 2x / week    PT Duration 4 weeks    PT Treatment/Interventions ADLs/Self Care Home Management;Therapeutic exercise;Patient/family education;Manual techniques;Manual lymph drainage;Passive range of motion;Scar mobilization    PT Next Visit Plan cont PROM right shoulder; AAROM exercises, STM    PT Home Exercise Plan Post op shoulder ROM HEP - reviewed all previously given HEP    Consulted and Agree with Plan of Care Patient;Family member/caregiver             Patient will benefit from skilled therapeutic intervention in order to improve the following deficits and impairments:  Decreased knowledge of precautions, Postural dysfunction, Decreased range of motion, Impaired UE functional use, Pain, Decreased scar mobility, Increased edema  Visit Diagnosis: Malignant neoplasm of lower-inner quadrant of right 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, estrogen receptor positive ([facility])  Stiffness of right shoulder, not elsewhere classified  Aftercare following surgery for neoplasm  Abnormal posture     Problem List Patient Active Problem List   Diagnosis Date Noted   Breast neoplasm, Tis (DCIS), right 02/13/2021   Genetic testing 01/31/2021   Family history of breast cancer 01/23/2021   Family history of prostate cancer 01/23/2021   Ductal carcinoma in situ (DCIS) of right breast 01/17/2021    [name] 03/12/2021, 1:54 PM  [place], [place], [address] Phone: [phone]   Fax:  [phone]  Name: Victoria Huang MRN: [id number] Date of Birth: May 26, 1946 [name], PT 03/12/21 1:55 PM

## 2021-03-13 ENCOUNTER — Ambulatory Visit: Payer: Medicare Other

## 2021-03-13 DIAGNOSIS — R293 Abnormal posture: Secondary | ICD-10-CM

## 2021-03-13 DIAGNOSIS — Z170 Estrogen receptor positive status [ER+]: Secondary | ICD-10-CM

## 2021-03-13 DIAGNOSIS — Z483 Aftercare following surgery for neoplasm: Secondary | ICD-10-CM

## 2021-03-13 DIAGNOSIS — C50311 Malignant neoplasm of lower-inner quadrant of right female breast: Secondary | ICD-10-CM

## 2021-03-13 DIAGNOSIS — M25611 Stiffness of right shoulder, not elsewhere classified: Secondary | ICD-10-CM

## 2021-03-13 NOTE — Therapy (Signed)
[place], [place], [address] Phone: [phone]   Fax:  [phone]  Physical Therapy Treatment  Patient Details  Name: Victoria Huang MRN: [id number] Date of Birth: [birth year] Referring Provider (PT): Dr. Donnie Mesa   Encounter Date: 03/13/2021   PT End of Session - 03/13/21 1150     Visit Number 4    Number of Visits 10    Date for PT Re-Evaluation 04/04/21    PT Start Time 1102    PT Stop Time 1200    PT Time Calculation (min) 58 min    Activity Tolerance Patient tolerated treatment well;Patient limited by pain    Behavior During Therapy [facility] for tasks assessed/performed             Past Medical History:  Diagnosis Date   Breast cancer ([place])    Family history of breast cancer 01/23/2021   Family history of prostate cancer 01/23/2021   High cholesterol    Hypertension     Past Surgical History:  Procedure Laterality Date   MASTECTOMY W/ SENTINEL NODE BIOPSY Right 02/13/2021   Procedure: RIGHT MASTECTOMY WITH SENTINEL LYMPH NODE BIOPSY;  Surgeon: Donnie Mesa, MD;  Location: [facility];  Service: General;  Laterality: Right;    There were no vitals filed for this visit.   Subjective Assessment - 03/13/21 1104     Subjective My arm is feeling much better and ROM is better.  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 I don't have to do any lifting.    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 She had a right mastectomy and sentinel node biopsy (1 negative node) on 02/13/2021.                               [facility] Adult PT Treatment/Exercise - 03/13/21 0001       Shoulder Exercises: Supine   Other Supine Exercises Supine wand flex and scaption x 8      Manual Therapy   Soft  tissue mobilization STM to right pectorals, lats, Scapular area in supine and SL    Myofascial Release to right axillary region of cording at approx 120 deg abd.    Passive ROM Right shoulder PROM flex, scaption, abd, ER with multiple VC's to relax                         PT Long Term Goals - 03/07/21 1005       PT LONG TERM GOAL #1   Title Patient will demonstrate she has regained full shoulder ROM and function post operatively compared to baselines.    Time 4    Period Weeks    Status On-going    Target Date 04/04/21      PT LONG TERM GOAL #2   Title Patient will report pain decreased to </= 4/10 with most daily tasks.    Baseline 7/10    Time 4    Period Weeks    Status New    Target Date 04/04/21      PT LONG TERM GOAL #3   Title Patient will increase right shoulder active flexion to >/=120 degrees for increased ease reaching overhead.  Baseline 83 post op; 144 pre-op    Time 4    Period Weeks    Status New    Target Date 04/04/21      PT LONG TERM GOAL #4   Title Patient will increase right shoulder active abduction to >/= 120 degrees for increased ease reaching, dressing, and performing kitchen tasks.    Baseline 70 post op; 150 pre op    Time 4    Period Weeks    Status New    Target Date 04/04/21      PT LONG TERM GOAL #5   Title Patient will verbalize good understanding of lymphedema risk reduction practices.    Time 4    Period Weeks    Status New    Target Date 04/04/21                   Plan - 03/13/21 1150     Clinical Impression Statement Continued soft tissue mobilization to pectorals and lats, AAROM and PROM to right shoulder, and initiated MFR to right axillary region of cording.  ROM is slowly improveing and pt does continue to require VC's to relax.  I tried her on the pulleys today and she did very well.  She may try to purchase some. She questioned me about return to work, and since job activities are not strenous I  told her it should be fine as long as she doesn't think she would be too fatigued.  Messaged MD about work note for her.    Stability/Clinical Decision Making Stable/Uncomplicated    Rehab Potential Excellent    PT Frequency 2x / week    PT Duration 4 weeks    PT Treatment/Interventions ADLs/Self Care Home Management;Therapeutic exercise;Patient/family education;Manual techniques;Manual lymph drainage;Passive range of motion;Scar mobilization    PT Next Visit Plan cont PROM right shoulder; AAROM exercises, STM,MFR to cording    PT Home Exercise Plan Post op shoulder ROM HEP - reviewed all previously given HEP    Consulted and Agree with Plan of Care Patient;Family member/caregiver             Patient will benefit from skilled therapeutic intervention in order to improve the following deficits and impairments:  Decreased knowledge of precautions, Postural dysfunction, Decreased range of motion, Impaired UE functional use, Pain, Decreased scar mobility, Increased edema  Visit Diagnosis: Malignant neoplasm of lower-inner quadrant of right 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, estrogen receptor positive ([facility])  Stiffness of right shoulder, not elsewhere classified  Aftercare following surgery for neoplasm  Abnormal posture     Problem List Patient Active Problem List   Diagnosis Date Noted   Breast neoplasm, Tis (DCIS), right 02/13/2021   Genetic testing 01/31/2021   Family history of breast cancer 01/23/2021   Family history of prostate cancer 01/23/2021   Ductal carcinoma in situ (DCIS) of right breast 01/17/2021    [name] 03/13/2021, 12:07 PM  [place], [place], [address] Phone: [phone]   Fax:  [phone]  Name: Victoria Huang MRN: [id number] Date of Birth: 1945/11/27 [name], PT 03/13/21 12:09 PM

## 2021-03-18 NOTE — Progress Notes (Signed)
Patient Care Team: Velna Hatchet, MD as PCP - General (Internal Medicine) Rockwell Germany, RN as Oncology Nurse Navigator Mauro Kaufmann, RN as Oncology Nurse Navigator Donnie Mesa, MD as Consulting Physician (General Surgery) Nicholas Lose, MD as Consulting Physician (Hematology and Oncology) Kyung Rudd, MD as Consulting Physician (Radiation Oncology)  DIAGNOSIS:    ICD-10-CM   1. Ductal carcinoma in situ (DCIS) of right breast  D05.11       SUMMARY OF ONCOLOGIC HISTORY: Oncology History  Ductal carcinoma in situ (DCIS) of right breast  01/17/2021 Initial Diagnosis   Screening mammogram detected right breast asymmetry and calcifications.  2 areas were detected 4 o'clock position 2.5 cm calcifications, LIQ: 9.1 cm, classifications.  Ultrasound-guided biopsy revealed high-grade DCIS with calcifications and necrosis ER 100%, PR 100%   02/04/2021 Genetic Testing   Negative hereditary cancer genetic testing: no pathogenic variants detected in [facility]-Expanded +RNAinsight Panel.  The report date is February 04, 2021.    The CustomNext-Cancer+RNAinsight panel offered by [facility] includes sequencing and rearrangement analysis for the following 47 genes:  APC, ATM, AXIN2, BARD1, BMPR1A, BRCA1, BRCA2, BRIP1, CDH1, CDK4, CDKN2A, CHEK2, DICER1, EPCAM, GREM1, HOXB13, MEN1, MLH1, MSH2, MSH3, MSH6, MUTYH, NBN, NF1, NF2, NTHL1, PALB2, PMS2, POLD1, POLE, PTEN, RAD51C, RAD51D, RECQL, RET, SDHA, SDHAF2, SDHB, SDHC, SDHD, SMAD4, SMARCA4, STK11, TP53, TSC1, TSC2, and VHL.  RNA data is routinely analyzed for use in variant interpretation for all genes.    02/13/2021 Surgery   Mastectomy of the right breast with Dr. Georgette Dover   Pathology: 1.8 cm intermediate to high grade DCIS with necrosis and calcifications, resection margins negative for DCIS, and axillary lymph node negative for carcinoma     CHIEF COMPLIANT: Follow-up of right breast cancer  INTERVAL HISTORY: Victoria Huang is a 75  y.o. with above-mentioned history of DCIS of the right breast cancer. She underwent a right mastectomy on 02/13/21 with Dr. Georgette Dover for which the pathology showed ductal carcinoma in situ, intermediate to high-grade with necrosis and calcifications, resection margins negative for DCIS, right axillary lymph node negative for carcinoma. She presents to the clinic today for follow-up.  ALLERGIES:  is allergic to other.  MEDICATIONS:  Current Outpatient Medications  Medication Sig Dispense Refill   amLODipine (NORVASC) 5 MG tablet Take 1 tablet by mouth at bedtime.     oxyCODONE (OXY IR/ROXICODONE) 5 MG immediate release tablet Take 1 tablet (5 mg total) by mouth every 6 (six) hours as needed for severe pain. 20 tablet 0   rosuvastatin (CRESTOR) 20 MG tablet Take 20 mg by mouth at bedtime.     No current facility-administered medications for this visit.    PHYSICAL EXAMINATION: ECOG PERFORMANCE STATUS: 1 - Symptomatic but completely ambulatory  Vitals:   03/19/21 1359  BP: (!) 159/74  Pulse: 76  Resp: 18  Temp: 97.7 F (36.5 C)  SpO2: 100%   Filed Weights   03/19/21 1359  Weight: 154 lb 4.8 oz (70 kg)     LABORATORY DATA:  I have reviewed the data as listed CMP Latest Ref Rng & Units 01/23/2021  Glucose 70 - 99 mg/dL 138(H)  BUN 8 - 23 mg/dL 12  Creatinine 0.44 - 1.00 mg/dL 0.72  Sodium 135 - 145 mmol/L 140  Potassium 3.5 - 5.1 mmol/L 3.9  Chloride 98 - 111 mmol/L 109  CO2 22 - 32 mmol/L 22  Calcium 8.9 - 10.3 mg/dL 9.5  Total Protein 6.5 - 8.1 g/dL 7.1  Total Bilirubin  0.3 - 1.2 mg/dL 0.4  Alkaline Phos 38 - 126 U/L 79  AST 15 - 41 U/L 17  ALT 0 - 44 U/L 12    Lab Results  Component Value Date   WBC 5.7 01/23/2021   HGB 12.1 01/23/2021   HCT 37.5 01/23/2021   MCV 80.8 01/23/2021   PLT 277 01/23/2021   NEUTROABS 3.2 01/23/2021    ASSESSMENT & PLAN:  Ductal carcinoma in situ (DCIS) of right breast 02/13/21: Rt Mastectomy: HG DCIS 1.8 cm, Margins Neg, 0/1 SLN Neg,  ER 100%, PR 100% TisN0 Stage 0  Pathology counseling: I discussed the final pathology report of the patient provided  a copy of this report. I discussed the margins.  We also discussed the final staging along with previously performed ER/PR testing.  Plan: Adjuvant Anti estrogen therapy with Tamoxifen for 5 years  Tamoxifen counseling: We discussed the risks and benefits of tamoxifen. These include but not limited to insomnia, hot flashes, mood changes, vaginal dryness, and weight gain. Although rare, serious side effects including endometrial cancer, risk of blood clots were also discussed. We strongly believe that the benefits far outweigh the risks. Patient understands these risks and consented to starting treatment. Planned treatment duration is 5 years.  RTC in 3 months for SCP visit     No orders of the defined types were placed in this encounter.  The patient has a good understanding of the overall plan. she agrees with it. she will call with any problems that may develop before the next visit here.  Total time spent: 20 mins including face to face time and time spent for planning, charting and coordination of care  Rulon Eisenmenger, MD, MPH 03/19/2021  I, Thana Ates, am acting as scribe for Dr. Nicholas Lose.  I have reviewed the above documentation for accuracy and completeness, and I agree with the above.

## 2021-03-19 ENCOUNTER — Ambulatory Visit: Payer: Medicare Other

## 2021-03-19 ENCOUNTER — Other Ambulatory Visit: Payer: Self-pay

## 2021-03-19 ENCOUNTER — Inpatient Hospital Stay: Payer: Medicare Other | Attending: Hematology and Oncology | Admitting: Hematology and Oncology

## 2021-03-19 DIAGNOSIS — D0511 Intraductal carcinoma in situ of right breast: Secondary | ICD-10-CM | POA: Diagnosis present

## 2021-03-19 DIAGNOSIS — Z170 Estrogen receptor positive status [ER+]: Secondary | ICD-10-CM | POA: Diagnosis not present

## 2021-03-19 DIAGNOSIS — Z9011 Acquired absence of right breast and nipple: Secondary | ICD-10-CM | POA: Insufficient documentation

## 2021-03-19 DIAGNOSIS — Z79810 Long term (current) use of selective estrogen receptor modulators (SERMs): Secondary | ICD-10-CM | POA: Diagnosis not present

## 2021-03-19 DIAGNOSIS — M25611 Stiffness of right shoulder, not elsewhere classified: Secondary | ICD-10-CM

## 2021-03-19 DIAGNOSIS — C50311 Malignant neoplasm of lower-inner quadrant of right female breast: Secondary | ICD-10-CM | POA: Diagnosis not present

## 2021-03-19 DIAGNOSIS — Z483 Aftercare following surgery for neoplasm: Secondary | ICD-10-CM

## 2021-03-19 DIAGNOSIS — R293 Abnormal posture: Secondary | ICD-10-CM

## 2021-03-19 MED ORDER — TAMOXIFEN CITRATE 20 MG PO TABS: 20.0000 mg | ORAL_TABLET | Freq: Every day | ORAL | 3 refills | Status: DC

## 2021-03-19 NOTE — Assessment & Plan Note (Signed)
02/13/21: Rt Mastectomy: HG DCIS 1.8 cm, Margins Neg, 0/1 SLN Neg, ER 100%, PR 100% TisN0 Stage 0  Pathology counseling: I discussed the final pathology report of the patient provided  a copy of this report. I discussed the margins.  We also discussed the final staging along with previously performed ER/PR testing.  Plan: Adjuvant Anti estrogen therapy with Tamoxifen for 5 years RTC in 3 months for SCP visit

## 2021-03-19 NOTE — Therapy (Signed)
[place], [place], [address] Phone: [phone]   Fax:  [phone]  Physical Therapy Treatment  Patient Details  Name: Victoria Huang MRN: [id number] Date of Birth: [birth year] Referring Provider (PT): Dr. Donnie Mesa   Encounter Date: 03/19/2021   PT End of Session - 03/19/21 1559     Visit Number 5    Number of Visits 10    Date for PT Re-Evaluation 04/04/21    PT Start Time 1503    PT Stop Time 1556    PT Time Calculation (min) 53 min    Activity Tolerance Patient tolerated treatment well    Behavior During Therapy [facility] for tasks assessed/performed             Past Medical History:  Diagnosis Date   Breast cancer ([place])    Family history of breast cancer 01/23/2021   Family history of prostate cancer 01/23/2021   High cholesterol    Hypertension     Past Surgical History:  Procedure Laterality Date   MASTECTOMY W/ SENTINEL NODE BIOPSY Right 02/13/2021   Procedure: RIGHT MASTECTOMY WITH SENTINEL LYMPH NODE BIOPSY;  Surgeon: Donnie Mesa, MD;  Location: [place] [facility];  Service: General;  Laterality: Right;    There were no vitals filed for this visit.   Subjective Assessment - 03/19/21 1504     Subjective I went to work Saturday and Sunday and you were right;I was tired. No problems with my right arm at all. I go back on Friday for 4 days.  I walked a mile yesterday and I felt good.. I still feel a little stiff.  I ordered the pulleys and I have been using them.  They work good.    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 She had a right mastectomy and sentinel node biopsy (1 negative node) on 02/13/2021.    Patient Stated Goals Get my arm back to normal    Currently in Pain? No/denies    Pain Score 0-No pain                               [facility] Adult PT Treatment/Exercise -  03/19/21 0001       Shoulder Exercises: Supine   Other Supine Exercises AROM supine flexion, scaption, horizontal abd x 5      Manual Therapy   Edema Management pt wearing prairie bra with 1/2 gray foam tucked    Soft tissue mobilization STM to right pectorals, lats, Scapular area in supine and SL    Myofascial Release to right axillary region of cording at approx 120 deg abd.    Manual Lymphatic Drainage (MLD) short neck, Right axillary and inguinal LN's, Right axillo-inguinal pathway, Right chest inferior to incision toward pathway, then repeating pathway and ending with LN's.    Passive ROM Right shoulder PROM flex, scaption, abd, ER with multiple VC's to relax                         PT Long Term Goals - 03/07/21 1005       PT LONG TERM GOAL #1   Title Patient will demonstrate she has regained full shoulder ROM and function post operatively compared to baselines.    Time 4    Period Weeks    Status On-going  Target Date 04/04/21      PT LONG TERM GOAL #2   Title Patient will report pain decreased to </= 4/10 with most daily tasks.    Baseline 7/10    Time 4    Period Weeks    Status New    Target Date 04/04/21      PT LONG TERM GOAL #3   Title Patient will increase right shoulder active flexion to >/=120 degrees for increased ease reaching overhead.    Baseline 83 post op; 144 pre-op    Time 4    Period Weeks    Status New    Target Date 04/04/21      PT LONG TERM GOAL #4   Title Patient will increase right shoulder active abduction to >/= 120 degrees for increased ease reaching, dressing, and performing kitchen tasks.    Baseline 70 post op; 150 pre op    Time 4    Period Weeks    Status New    Target Date 04/04/21      PT LONG TERM GOAL #5   Title Patient will verbalize good understanding of lymphedema risk reduction practices.    Time 4    Period Weeks    Status New    Target Date 04/04/21                   Plan - 03/19/21  1601     Clinical Impression Statement Continued soft tisue mobilization to right pectorals, lats, scapular region, PROM and MFR techniques to cording.  Initiated MLD to right inferior mastectomy incision using the Right axillo-inguinal pathway secondary to swelling and fibrosis noted here. Pts steri-strips are still present and she was advised to let shower contact her chest to start to pull them off.  She was advised to continue wearing her compression bra with foam pad and we will check next visit and see if we need to transition to a chip pack for that area.    Stability/Clinical Decision Making Stable/Uncomplicated    Rehab Potential Excellent    PT Frequency 2x / week    PT Duration 4 weeks    PT Treatment/Interventions ADLs/Self Care Home Management;Therapeutic exercise;Patient/family education;Manual techniques;Manual lymph drainage;Passive range of motion;Scar mobilization    PT Next Visit Plan cont PROM right shoulder; AAROM exercises, AROM exs STM,MFR to cording, chip pack instead of gray foam for swelling distal to incision    PT Home Exercise Plan Post op shoulder ROM HEP - reviewed all previously given HEP    Consulted and Agree with Plan of Care Patient             Patient will benefit from skilled therapeutic intervention in order to improve the following deficits and impairments:  Decreased knowledge of precautions, Postural dysfunction, Decreased range of motion, Impaired UE functional use, Pain, Decreased scar mobility, Increased edema  Visit Diagnosis: Malignant neoplasm of lower-inner quadrant of right 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, estrogen receptor positive (HCC)  Stiffness of right shoulder, not elsewhere classified  Aftercare following surgery for neoplasm  Abnormal posture     Problem List Patient Active Problem List   Diagnosis Date Noted   Breast neoplasm, Tis (DCIS), right 02/13/2021   Genetic testing 01/31/2021   Family history of breast cancer 01/23/2021    Family history of prostate cancer 01/23/2021   Ductal carcinoma in situ (DCIS) of right breast 01/17/2021    Elsie Ra Allah Reason 03/19/2021, 4:07 PM  [place] [phone]  [place], [place], [address] Phone: [phone]   Fax:  [phone]  Name: Victoria Huang MRN: [id number] Date of Birth: [birth year]  [name], PT 03/19/21 4:08 PM

## 2021-03-21 ENCOUNTER — Ambulatory Visit: Payer: Medicare Other

## 2021-03-21 ENCOUNTER — Other Ambulatory Visit: Payer: Self-pay

## 2021-03-21 ENCOUNTER — Encounter: Payer: Self-pay | Admitting: *Deleted

## 2021-03-21 DIAGNOSIS — Z483 Aftercare following surgery for neoplasm: Secondary | ICD-10-CM

## 2021-03-21 DIAGNOSIS — C50311 Malignant neoplasm of lower-inner quadrant of right female breast: Secondary | ICD-10-CM | POA: Diagnosis not present

## 2021-03-21 DIAGNOSIS — D0511 Intraductal carcinoma in situ of right breast: Secondary | ICD-10-CM

## 2021-03-21 DIAGNOSIS — M25611 Stiffness of right shoulder, not elsewhere classified: Secondary | ICD-10-CM

## 2021-03-21 DIAGNOSIS — R293 Abnormal posture: Secondary | ICD-10-CM

## 2021-03-21 NOTE — Therapy (Signed)
[place], [place], [address] Phone: [phone]   Fax:  [phone]  Physical Therapy Treatment  Patient Details  Name: Victoria Huang MRN: [id number] Date of Birth: Mar 21, 1946 Referring Provider (PT): Dr. Donnie Huang   Encounter Date: 03/21/2021   PT End of Session - 03/21/21 1211     Visit Number 6    Number of Visits 10    Date for PT Re-Evaluation 04/04/21    PT Start Time 1103    PT Stop Time 1150    PT Time Calculation (min) 47 min    Activity Tolerance Patient tolerated treatment well    Behavior During Therapy [facility] for tasks assessed/performed             Past Medical History:  Diagnosis Date   Breast cancer ([place])    Family history of breast cancer 01/23/2021   Family history of prostate cancer 01/23/2021   High cholesterol    Hypertension     Past Surgical History:  Procedure Laterality Date   MASTECTOMY W/ SENTINEL NODE BIOPSY Right 02/13/2021   Procedure: RIGHT MASTECTOMY WITH SENTINEL LYMPH NODE BIOPSY;  Surgeon: Victoria Mesa, MD;  Location: [place];  Service: General;  Laterality: Right;    There were no vitals filed for this visit.   Subjective Assessment - 03/21/21 1106     Subjective I did OK after last visit, but I feel a little soreness. The steri strips still won't come off even though I let the water run on them. I have a little redness on the right side    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 She had a right mastectomy and sentinel node biopsy (1 negative node) on 02/13/2021.    Patient Stated Goals Get my arm back to normal    Currently in Pain? No/denies    Pain Score 0-No pain                               [facility] Adult PT Treatment/Exercise - 03/21/21 0001       Manual Therapy   Manual therapy comments Assessed new redness found by pt.  Had [facility] PT also check.  Mildly red, non tender but messaged Dr. Georgette Huang and nurse navigators.  She was called during rx and was told they are putting her on keflex for 10 days and she will see Dr. Georgette Huang on Monday    Edema Management chip pack made for swollen area after antibiotics are finished    Manual Lymphatic Drainage (MLD) pt instructed briefly in self MLD but avoiding red areas and discussing amount of pressure, LN activation, pathways etc.  Pt was not given handout and will not perform until given OK by therapist                         PT Long Term Goals - 03/07/21 1005       PT LONG TERM GOAL #1   Title Patient will demonstrate she has regained full shoulder ROM and function post operatively compared to baselines.    Time 4    Period Weeks    Status On-going    Target Date 04/04/21      PT LONG TERM GOAL #2   Title Patient will report pain decreased to </= 4/10 with most daily  tasks.    Baseline 7/10    Time 4    Period Weeks    Status New    Target Date 04/04/21      PT LONG TERM GOAL #3   Title Patient will increase right shoulder active flexion to >/=120 degrees for increased ease reaching overhead.    Baseline 83 post op; 144 pre-op    Time 4    Period Weeks    Status New    Target Date 04/04/21      PT LONG TERM GOAL #4   Title Patient will increase right shoulder active abduction to >/= 120 degrees for increased ease reaching, dressing, and performing kitchen tasks.    Baseline 70 post op; 150 pre op    Time 4    Period Weeks    Status New    Target Date 04/04/21      PT LONG TERM GOAL #5   Title Patient will verbalize good understanding of lymphedema risk reduction practices.    Time 4    Period Weeks    Status New    Target Date 04/04/21                   Plan - 03/21/21 1211     Clinical Impression Statement Pt came in and mentioned a new redness at right chest.  Area mildly red and with continued swelling and firmness as  she had earlier this week. Pt felt fine and had no increased pain. Took a Clinical research associate and placed in media and messaged MD's and Biomedical engineer.  Victoria Huang from [facility] called and is placing her on keflex for 10 days.  She will see Dr. Georgette Huang on Monday at 8:30.  A Chip pack was made for pt but she will not use until after being given permission by PT.  We also started performing MLD but avoiding red area for several minutes activating LN's and doing deep breathing and trunk activation below area of redness.  She knows not to perform until given approval of PT.    Stability/Clinical Decision Making Stable/Uncomplicated    Rehab Potential Excellent    PT Frequency 2x / week    PT Duration 4 weeks    PT Treatment/Interventions ADLs/Self Care Home Management;Therapeutic exercise;Patient/family education;Manual techniques;Manual lymph drainage;Passive range of motion;Scar mobilization    PT Next Visit Plan How did antibiotics do, MD appt (mon)cont PROM right shoulder; AAROM exercises, AROM exs STM,MFR to cording, chip pack instead of gray foam for swelling distal to incision to start after redness is gone only    PT Home Exercise Plan Post op shoulder ROM HEP - reviewed all previously given HEP    Consulted and Agree with Plan of Care Patient             Patient will benefit from skilled therapeutic intervention in order to improve the following deficits and impairments:  Decreased knowledge of precautions, Postural dysfunction, Decreased range of motion, Impaired UE functional use, Pain, Decreased scar mobility, Increased edema  Visit Diagnosis: Malignant neoplasm of lower-inner quadrant of right 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, estrogen receptor positive (HCC)  Stiffness of right shoulder, not elsewhere classified  Aftercare following surgery for neoplasm  Abnormal posture     Problem List Patient Active Problem List   Diagnosis Date Noted   Breast neoplasm, Tis (DCIS), right 02/13/2021   Genetic  testing 01/31/2021   Family history of breast cancer 01/23/2021   Family history of prostate cancer 01/23/2021  Ductal carcinoma in situ (DCIS) of right breast 01/17/2021    Victoria Huang 03/21/2021, 12:21 PM  [place], [place], [address] Phone: [phone]   Fax:  [phone]  Name: Victoria Huang MRN: [id number] Date of Birth: October 24, 1945 [name], PT 03/21/21 12:22 PM

## 2021-03-26 ENCOUNTER — Ambulatory Visit: Payer: Medicare Other

## 2021-03-26 ENCOUNTER — Other Ambulatory Visit: Payer: Self-pay

## 2021-03-26 DIAGNOSIS — C50311 Malignant neoplasm of lower-inner quadrant of right female breast: Secondary | ICD-10-CM | POA: Diagnosis not present

## 2021-03-26 DIAGNOSIS — Z483 Aftercare following surgery for neoplasm: Secondary | ICD-10-CM

## 2021-03-26 DIAGNOSIS — R293 Abnormal posture: Secondary | ICD-10-CM

## 2021-03-26 DIAGNOSIS — M25611 Stiffness of right shoulder, not elsewhere classified: Secondary | ICD-10-CM

## 2021-03-26 DIAGNOSIS — Z170 Estrogen receptor positive status [ER+]: Secondary | ICD-10-CM

## 2021-03-26 NOTE — Patient Instructions (Signed)
Manual Lymph Drainage for Right Breast.  Do daily.  Do slowly. Use flat hands with just enough pressure to stretch the skin. Do not slide over the skin, but move the skin with the hand you're using. Lie down or sit comfortably (in a recliner, for example) to do this.  Hug yourself:  cross arms and do circles at collar bones near neck 5-7 times (to "wake up" lots of lymph nodes in this area). Take slow deep breaths, allowing your belly to balloon out as your breathe in, 5x (to "wake up" abdominal lymph nodes to take on extra fluid). Right armpit; stretch skin in small circles to stimulate lymphnodes Right groin area, at panty line--stretch skin in small circles to stimulate lymph nodes 5-7x. Redirect fluid from right armpit toward right groin (cup your hand around the curve of your right side and do 3-4 "pumps" from armpit to groin) 3-4x down your side. direct fluid  from center of chest to the right pathway established in #4 multiple times  Then repeat #4 above.  End with repeating #3 and #4 above.   [facility] [address] [place]   [address] [phone]

## 2021-03-26 NOTE — Therapy (Signed)
[place], [place], [address] Phone: [phone]   Fax:  [phone]  Physical Therapy Treatment  Patient Details  Name: Victoria Huang MRN: [id number] Date of Birth: June 20, 1946 Referring Provider (PT): Dr. Donnie Mesa   Encounter Date: 03/26/2021   PT End of Session - 03/26/21 1209     Visit Number 7    Number of Visits 10    Date for PT Re-Evaluation 04/04/21    PT Start Time 1103    PT Stop Time 1204    PT Time Calculation (min) 61 min    Activity Tolerance Patient tolerated treatment well    Behavior During Therapy [facility] for tasks assessed/performed             Past Medical History:  Diagnosis Date   Breast cancer ([place])    Family history of breast cancer 01/23/2021   Family history of prostate cancer 01/23/2021   High cholesterol    Hypertension     Past Surgical History:  Procedure Laterality Date   MASTECTOMY W/ SENTINEL NODE BIOPSY Right 02/13/2021   Procedure: RIGHT MASTECTOMY WITH SENTINEL LYMPH NODE BIOPSY;  Surgeon: Donnie Mesa, MD;  Location: [place];  Service: General;  Laterality: Right;    There were no vitals filed for this visit.   Subjective Assessment - 03/26/21 1101     Subjective Saw the doctor yesterday and the redness is gone.  I will finish the antibiotic.  The MD took the steristrips off. I am sore in the axillary region but not where the redness was.    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 She had a right mastectomy and sentinel node biopsy (1 negative node) on 02/13/2021.    Currently in Pain? No/denies    Pain Score 0-No pain                               [facility] Adult PT Treatment/Exercise - 03/26/21 0001       Manual Therapy   Edema Management chip pack placed in bra over area of swelling today    Soft tissue mobilization STM to  right pectorals, lats, Scapular area in supine and SL    Myofascial Release to right axillary region of cording at approx 120 deg abd.    Manual Lymphatic Drainage (MLD) short neck, Right axillary and inguinal LN's, Right axillo-inguinal pathway, Right chest inferior to incision toward pathway, then repeating pathway and ending with LN's. Pt instructed in same with head of table elevated.    Passive ROM Right shoulder PROM flex, scaption, abd, ER with multiple VC's to relax                    PT Education - 03/26/21 1209     Education Details Educated in self MLD    Person(s) Educated Patient    Methods Explanation;Demonstration;Handout    Comprehension Need further instruction                 PT Long Term Goals - 03/07/21 1005       PT LONG TERM GOAL #1   Title Patient will demonstrate she has regained full shoulder ROM and function post operatively compared to baselines.    Time 4    Period Weeks    Status On-going    Target Date 04/04/21  PT LONG TERM GOAL #2   Title Patient will report pain decreased to </= 4/10 with most daily tasks.    Baseline 7/10    Time 4    Period Weeks    Status New    Target Date 04/04/21      PT LONG TERM GOAL #3   Title Patient will increase right shoulder active flexion to >/=120 degrees for increased ease reaching overhead.    Baseline 83 post op; 144 pre-op    Time 4    Period Weeks    Status New    Target Date 04/04/21      PT LONG TERM GOAL #4   Title Patient will increase right shoulder active abduction to >/= 120 degrees for increased ease reaching, dressing, and performing kitchen tasks.    Baseline 70 post op; 150 pre op    Time 4    Period Weeks    Status New    Target Date 04/04/21      PT LONG TERM GOAL #5   Title Patient will verbalize good understanding of lymphedema risk reduction practices.    Time 4    Period Weeks    Status New    Target Date 04/04/21                   Plan -  03/26/21 1210     Clinical Impression Statement Redness at pts chest is now gone and firmness has improved although swelling is still present.  Pt did very well today with PROM and is improving nicely there.  She is compliant with her pulleys at home. We practiced Self MLD today and she did quite well but will need review.  She required VC's and TC's for direction and sequence. Placed chip pack in bra at end of session to decrease swelling. cording is still present in right axillary region    Stability/Clinical Decision Making Stable/Uncomplicated    Rehab Potential Excellent    PT Frequency 2x / week    PT Duration 4 weeks    PT Treatment/Interventions ADLs/Self Care Home Management;Therapeutic exercise;Patient/family education;Manual techniques;Manual lymph drainage;Passive range of motion;Scar mobilization    PT Next Visit Plan assess chip pack, try MLD? Continue AAROM, PROM, review self MLD, MFR to cording    PT Home Exercise Plan Post op shoulder ROM HEP - reviewed all previously given HEP, pulleys, Self MLD    Consulted and Agree with Plan of Care Patient             Patient will benefit from skilled therapeutic intervention in order to improve the following deficits and impairments:  Decreased knowledge of precautions, Postural dysfunction, Decreased range of motion, Impaired UE functional use, Pain, Decreased scar mobility, Increased edema  Visit Diagnosis: Malignant neoplasm of lower-inner quadrant of right 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, estrogen receptor positive (HCC)  Stiffness of right shoulder, not elsewhere classified  Aftercare following surgery for neoplasm  Abnormal posture     Problem List Patient Active Problem List   Diagnosis Date Noted   Breast neoplasm, Tis (DCIS), right 02/13/2021   Genetic testing 01/31/2021   Family history of breast cancer 01/23/2021   Family history of prostate cancer 01/23/2021   Ductal carcinoma in situ (DCIS) of right breast 01/17/2021     [name] 03/26/2021, 12:17 PM  [place], [place], [address] Phone: [phone]   Fax:  [phone]  Name: GAYLON THEARD MRN: [id number] Date  of Birth: Dec 26, 1945 [name], PT 03/26/21 12:17 PM

## 2021-03-28 ENCOUNTER — Ambulatory Visit: Payer: Medicare Other

## 2021-03-28 ENCOUNTER — Other Ambulatory Visit: Payer: Self-pay

## 2021-03-28 DIAGNOSIS — Z170 Estrogen receptor positive status [ER+]: Secondary | ICD-10-CM

## 2021-03-28 DIAGNOSIS — M25611 Stiffness of right shoulder, not elsewhere classified: Secondary | ICD-10-CM

## 2021-03-28 DIAGNOSIS — Z483 Aftercare following surgery for neoplasm: Secondary | ICD-10-CM

## 2021-03-28 DIAGNOSIS — C50311 Malignant neoplasm of lower-inner quadrant of right female breast: Secondary | ICD-10-CM

## 2021-03-28 DIAGNOSIS — R293 Abnormal posture: Secondary | ICD-10-CM

## 2021-03-28 NOTE — Therapy (Signed)
[place], [place], [address] Phone: [phone]   Fax:  [phone]  Physical Therapy Treatment  Patient Details  Name: Victoria Huang MRN: [id number] Date of Birth: 10/27/1945 Referring Provider (PT): Dr. Donnie Mesa   Encounter Date: 03/28/2021   PT End of Session - 03/28/21 1001     Visit Number 8    Number of Visits 10    Date for PT Re-Evaluation 04/04/21    PT Start Time 0924    PT Stop Time 0959    PT Time Calculation (min) 35 min    Activity Tolerance Patient tolerated treatment well    Behavior During Therapy [facility] for tasks assessed/performed             Past Medical History:  Diagnosis Date   Breast cancer ([place])    Family history of breast cancer 01/23/2021   Family history of prostate cancer 01/23/2021   High cholesterol    Hypertension     Past Surgical History:  Procedure Laterality Date   MASTECTOMY W/ SENTINEL NODE BIOPSY Right 02/13/2021   Procedure: RIGHT MASTECTOMY WITH SENTINEL LYMPH NODE BIOPSY;  Surgeon: Donnie Mesa, MD;  Location: [place];  Service: General;  Laterality: Right;    There were no vitals filed for this visit.   Subjective Assessment - 03/28/21 0925     Subjective I overslept this am. pt 23 min. late.  I didn't do the exercises today. Can't sleep on the right yet because it is a little uncomfortable. 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 She had a right mastectomy and sentinel node biopsy (1 negative node) on 02/13/2021.    Currently in Pain? No/denies    Pain Score 0-No pain                               [facility] Adult PT Treatment/Exercise - 03/28/21 0001       Shoulder Exercises: Supine   Other Supine Exercises Supine wand flex and scaption x 3      Manual Therapy   Edema Management  Continued use of chip pack. good softening noted    Manual Lymphatic Drainage (MLD) Pt instructed in self MLD,short neck, 5 breaths, Right axillary and inguinal LN's, Right axillo-inguinal pathway, Right chest inferior to incision toward pathway, then repeating pathway and ending with LN's..                         PT Long Term Goals - 03/07/21 1005       PT LONG TERM GOAL #1   Title Patient will demonstrate she has regained full shoulder ROM and function post operatively compared to baselines.    Time 4    Period Weeks    Status On-going    Target Date 04/04/21      PT LONG TERM GOAL #2   Title Patient will report pain decreased to </= 4/10 with most daily tasks.    Baseline 7/10    Time 4    Period Weeks    Status New    Target Date 04/04/21      PT LONG TERM GOAL #3   Title Patient will increase right shoulder active flexion to >/=120 degrees for increased ease reaching overhead.  Baseline 83 post op; 144 pre-op    Time 4    Period Weeks    Status New    Target Date 04/04/21      PT LONG TERM GOAL #4   Title Patient will increase right shoulder active abduction to >/= 120 degrees for increased ease reaching, dressing, and performing kitchen tasks.    Baseline 70 post op; 150 pre op    Time 4    Period Weeks    Status New    Target Date 04/04/21      PT LONG TERM GOAL #5   Title Patient will verbalize good understanding of lymphedema risk reduction practices.    Time 4    Period Weeks    Status New    Target Date 04/04/21                   Plan - 03/28/21 1002     Clinical Impression Statement Pt has been compliant with chip pack and area has softened nicely.  She was very late today so treatment was limited.  We reviewed supine wand for flexion and scaption with good increase in ROM noted.  We spent the remainder of time practicing Self MLD.  She still requires multiple VC's and TC's for proper technique but is starting to understand the  sequence.    Stability/Clinical Decision Making Stable/Uncomplicated    Rehab Potential Excellent    PT Frequency 2x / week    PT Duration 4 weeks    PT Treatment/Interventions ADLs/Self Care Home Management;Therapeutic exercise;Patient/family education;Manual techniques;Manual lymph drainage;Passive range of motion;Scar mobilization    PT Next Visit Plan assess chip pack,  MLD,Continue AAROM, PROM, review self MLD, MFR to cording    PT Home Exercise Plan Post op shoulder ROM HEP - reviewed all previously given HEP, pulleys, Self MLD    Consulted and Agree with Plan of Care Patient             Patient will benefit from skilled therapeutic intervention in order to improve the following deficits and impairments:  Decreased knowledge of precautions, Postural dysfunction, Decreased range of motion, Impaired UE functional use, Pain, Decreased scar mobility, Increased edema  Visit Diagnosis: Malignant neoplasm of lower-inner quadrant of right 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, estrogen receptor positive (HCC)  Stiffness of right shoulder, not elsewhere classified  Aftercare following surgery for neoplasm  Abnormal posture     Problem List Patient Active Problem List   Diagnosis Date Noted   Breast neoplasm, Tis (DCIS), right 02/13/2021   Genetic testing 01/31/2021   Family history of breast cancer 01/23/2021   Family history of prostate cancer 01/23/2021   Ductal carcinoma in situ (DCIS) of right breast 01/17/2021    [name] 03/28/2021, 10:12 AM  [place], [place], [address] Phone: [phone]   Fax:  [phone]  Name: RAZAN BELICH MRN: [id number] Date of Birth: 1946-04-08 [name], PT 03/28/21 10:13 AM

## 2021-04-02 ENCOUNTER — Ambulatory Visit: Payer: Medicare Other

## 2021-04-02 ENCOUNTER — Other Ambulatory Visit: Payer: Self-pay

## 2021-04-02 DIAGNOSIS — Z483 Aftercare following surgery for neoplasm: Secondary | ICD-10-CM

## 2021-04-02 DIAGNOSIS — Z170 Estrogen receptor positive status [ER+]: Secondary | ICD-10-CM

## 2021-04-02 DIAGNOSIS — C50311 Malignant neoplasm of lower-inner quadrant of right female breast: Secondary | ICD-10-CM

## 2021-04-02 DIAGNOSIS — R293 Abnormal posture: Secondary | ICD-10-CM

## 2021-04-02 DIAGNOSIS — M25611 Stiffness of right shoulder, not elsewhere classified: Secondary | ICD-10-CM

## 2021-04-02 NOTE — Patient Instructions (Signed)

## 2021-04-02 NOTE — Therapy (Signed)
[place], [place], [address] Phone: [phone]   Fax:  [phone]  Physical Therapy Treatment  Patient Details  Name: Victoria Huang MRN: [id number] Date of Birth: Apr 26, 1946 Referring Provider (PT): Dr. Donnie Mesa   Encounter Date: 04/02/2021   PT End of Session - 04/02/21 1136     Visit Number 9    Number of Visits 10    Date for PT Re-Evaluation 04/04/21    PT Start Time 1104    PT Stop Time 1159    PT Time Calculation (min) 55 min    Activity Tolerance Patient tolerated treatment well    Behavior During Therapy [facility] for tasks assessed/performed             Past Medical History:  Diagnosis Date   Breast cancer ([place])    Family history of breast cancer 01/23/2021   Family history of prostate cancer 01/23/2021   High cholesterol    Hypertension     Past Surgical History:  Procedure Laterality Date   MASTECTOMY W/ SENTINEL NODE BIOPSY Right 02/13/2021   Procedure: RIGHT MASTECTOMY WITH SENTINEL LYMPH NODE BIOPSY;  Surgeon: Donnie Mesa, MD;  Location: [place];  Service: General;  Laterality: Right;    There were no vitals filed for this visit.   Subjective Assessment - 04/02/21 1104     Subjective The chip pack seems to help but it is still swollen especially under the right arm.  I have done MLD some too. I can reach and put my washcloth up, I can lift light things now.  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 She had a right mastectomy and sentinel node biopsy (1 negative node) on 02/13/2021.    Patient Stated Goals Get my arm back to normal    Currently in Pain? No/denies    Pain Score 0-No pain                               [facility] Adult PT Treatment/Exercise - 04/02/21 0001        Shoulder Exercises: Supine   Horizontal ABduction Strengthening;Both;10 reps    Theraband Level (Shoulder Horizontal ABduction) Level 1 (Yellow)    Flexion Strengthening;Both;10 reps    Theraband Level (Shoulder Flexion) Level 1 (Yellow)    Diagonals Strengthening;Right;Left;5 reps    Theraband Level (Shoulder Diagonals) Level 1 (Yellow)      Manual Therapy   Edema Management assisted with placing pack in bra to cover inferior axillary area of swelling   Myofascial Release to right axillary region of cording at approx 90 and 120 deg abd.    Manual Lymphatic Drainage (MLD) short neck, 5 breaths, Right axillary and inguinal LN's, Right axillo-inguinal pathway, Right chest inferior to incision toward pathway, then repeating pathway and ending with LN's.    Passive ROM Right shoulder PROM flex, scaption, abd, ER with multiple VC's to relax                    PT Education - 04/02/21 1135     Education Details educated in supine scapular series with yellow band    Person(s) Educated Patient    Methods Handout;Demonstration    Comprehension Returned demonstration  PT Long Term Goals - 03/07/21 1005       PT LONG TERM GOAL #1   Title Patient will demonstrate she has regained full shoulder ROM and function post operatively compared to baselines.    Time 4    Period Weeks    Status On-going    Target Date 04/04/21      PT LONG TERM GOAL #2   Title Patient will report pain decreased to </= 4/10 with most daily tasks.    Baseline 7/10    Time 4    Period Weeks    Status New    Target Date 04/04/21      PT LONG TERM GOAL #3   Title Patient will increase right shoulder active flexion to >/=120 degrees for increased ease reaching overhead.    Baseline 83 post op; 144 pre-op    Time 4    Period Weeks    Status New    Target Date 04/04/21      PT LONG TERM GOAL #4   Title Patient will increase right shoulder active abduction to >/= 120  degrees for increased ease reaching, dressing, and performing kitchen tasks.    Baseline 70 post op; 150 pre op    Time 4    Period Weeks    Status New    Target Date 04/04/21      PT LONG TERM GOAL #5   Title Patient will verbalize good understanding of lymphedema risk reduction practices.    Time 4    Period Weeks    Status New    Target Date 04/04/21                   Plan - 04/02/21 1201     Clinical Impression Statement pt with slight increase in edema noted at inferior axillary region. Performed MLD and assisted pt with chip pack location after rx.  Pt with a new very tight cord from axilla to mid upper arm. Performed MFR techniques and had 1 pop.  Cording much less visible after MFR and improved PROM noted. Pt was instructed in supine scapular series.  She was unable to do full ROM with horizontal abd, but did very well with the others.  It was added to HEP.    Stability/Clinical Decision Making Stable/Uncomplicated    Rehab Potential Excellent    PT Frequency 2x / week    PT Duration 4 weeks    PT Treatment/Interventions ADLs/Self Care Home Management;Therapeutic exercise;Patient/family education;Manual techniques;Manual lymph drainage;Passive range of motion;Scar mobilization    PT Next Visit Plan recert,  review supine scap series,cont. chip pack, progress ROM/strength, Self MLD, MFR to cording    PT Home Exercise Plan Post op shoulder ROM HEP - reviewed all previously given HEP, pulleys, Self MLD, supine scap series    Consulted and Agree with Plan of Care Patient             Patient will benefit from skilled therapeutic intervention in order to improve the following deficits and impairments:  Decreased knowledge of precautions, Postural dysfunction, Decreased range of motion, Impaired UE functional use, Pain, Decreased scar mobility, Increased edema  Visit Diagnosis: Malignant neoplasm of lower-inner quadrant of right 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, estrogen receptor  positive (HCC)  Stiffness of right shoulder, not elsewhere classified  Aftercare following surgery for neoplasm  Abnormal posture     Problem List Patient Active Problem List   Diagnosis Date Noted   Breast neoplasm, Tis (DCIS), right 02/13/2021  Genetic testing 01/31/2021   Family history of breast cancer 01/23/2021   Family history of prostate cancer 01/23/2021   Ductal carcinoma in situ (DCIS) of right breast 01/17/2021    [name] 04/02/2021, 12:06 PM  [place], [place], [address] Phone: [phone]   Fax:  [phone]  Name: Victoria Huang MRN: [id number] Date of Birth: Jan 28, 1946  [name], PT 04/02/21 12:07 PM

## 2021-04-04 ENCOUNTER — Other Ambulatory Visit: Payer: Self-pay

## 2021-04-04 ENCOUNTER — Ambulatory Visit: Payer: Medicare Other | Attending: Surgery

## 2021-04-04 DIAGNOSIS — C50311 Malignant neoplasm of lower-inner quadrant of right female breast: Secondary | ICD-10-CM | POA: Diagnosis not present

## 2021-04-04 DIAGNOSIS — Z483 Aftercare following surgery for neoplasm: Secondary | ICD-10-CM | POA: Insufficient documentation

## 2021-04-04 DIAGNOSIS — M25611 Stiffness of right shoulder, not elsewhere classified: Secondary | ICD-10-CM | POA: Insufficient documentation

## 2021-04-04 DIAGNOSIS — R293 Abnormal posture: Secondary | ICD-10-CM | POA: Diagnosis present

## 2021-04-04 DIAGNOSIS — Z170 Estrogen receptor positive status [ER+]: Secondary | ICD-10-CM | POA: Diagnosis present

## 2021-04-04 NOTE — Therapy (Signed)
[place], [place], [address] Phone: [phone]   Fax:  [phone]  Physical Therapy Treatment  Patient Details  Name: Victoria Huang MRN: [id number] Date of Birth: 12-18-1945 Referring Provider (PT): Dr. Donnie Mesa   Encounter Date: 04/04/2021   PT End of Session - 04/04/21 1153     Visit Number 10    Number of Visits 18    Date for PT Re-Evaluation 05/02/21    PT Start Time 1107    PT Stop Time 1150    PT Time Calculation (min) 43 min    Activity Tolerance Patient tolerated treatment well    Behavior During Therapy [facility] for tasks assessed/performed             Past Medical History:  Diagnosis Date   Breast cancer ([place])    Family history of breast cancer 01/23/2021   Family history of prostate cancer 01/23/2021   High cholesterol    Hypertension     Past Surgical History:  Procedure Laterality Date   MASTECTOMY W/ SENTINEL NODE BIOPSY Right 02/13/2021   Procedure: RIGHT MASTECTOMY WITH SENTINEL LYMPH NODE BIOPSY;  Surgeon: Donnie Mesa, MD;  Location: [facility];  Service: General;  Laterality: Right;    There were no vitals filed for this visit.   Subjective Assessment - 04/04/21 1106     Subjective The chip pack seems to help but it is still swollen especially under the right arm.  I have done MLD some too. I can reach higher and put my washcloth up, I can lift light things now.  It feels a little uncomfortable to sleep on right side but I can stay there a little. The area of cording feels much better today. I have returned to work. Still a little tender at the swollen area under my arm.  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 She had a right mastectomy and sentinel node biopsy (1 negative node) on 02/13/2021.    Patient Stated Goals Get my arm  back to normal    Currently in Pain? No/denies    Pain Score 0-No pain                [facility] PT Assessment - 04/04/21 0001       Assessment   Medical Diagnosis s/p right mastectomy and SLNB    Referring Provider (PT) Dr. Donnie Mesa    Onset Date/Surgical Date 02/13/21    Hand Dominance Right      Observation/Other Assessments   Observations Pt continues with areas at anterior and lateral chest with what appears to be swelling but may be some extra tissue.      AROM   Right Shoulder Extension 55 Degrees    Right Shoulder Flexion 139 Degrees    Right Shoulder ABduction 145 Degrees    Right Shoulder External Rotation 98 Degrees               LYMPHEDEMA/ONCOLOGY QUESTIONNAIRE - 04/04/21 0001       Type   Cancer Type Right breast cancer      Surgeries   Mastectomy Date 02/13/21    Sentinel Lymph Node Biopsy Date 02/13/21    Number Lymph Nodes Removed 1      Treatment   Active Chemotherapy Treatment No    Past Chemotherapy Treatment No  Active Radiation Treatment No    Past Radiation Treatment No    Current Hormone Treatment Yes    Drug Name Anastrazole    Past Hormone Therapy No      Right Upper Extremity Lymphedema   10 cm Proximal to Olecranon Process 29.6 cm    Olecranon Process 26 cm    10 cm Proximal to Ulnar Styloid Process 21.4 cm    Just Proximal to Ulnar Styloid Process 16.2 cm    Across Hand at [employer] 20.2 cm    At [place] of 2nd Digit 6.3 cm                Quick Dash - 04/04/21 0001     Open a tight or new jar No difficulty    Do heavy household chores (wash walls, wash floors) Mild difficulty    Carry a shopping bag or briefcase No difficulty    Wash your back No difficulty    Use a knife to cut food No difficulty    Recreational activities in which you take some force or impact through your arm, shoulder, or hand (golf, hammering, tennis) Mild difficulty    During the past week, to what extent has your arm, shoulder or  hand problem interfered with your normal social activities with family, friends, neighbors, or groups? Not at all    During the past week, to what extent has your arm, shoulder or hand problem limited your work or other regular daily activities Not at all    Arm, shoulder, or hand pain. Mild    Tingling (pins and needles) in your arm, shoulder, or hand None    Difficulty Sleeping Mild difficulty    DASH Score 9.09 %                    [facility] Adult PT Treatment/Exercise - 04/04/21 0001       Shoulder Exercises: Supine   Other Supine Exercises Supine wand flex and scaption x 5, star gazer x 5                         PT Long Term Goals - 04/04/21 1128       PT LONG TERM GOAL #1   Title Patient will demonstrate she has regained full shoulder ROM and function post operatively compared to baselines.    Time 4    Period Weeks    Status On-going    Target Date 05/02/21      PT LONG TERM GOAL #2   Title Patient will report pain decreased to </= 4/10 with most daily tasks.    Time 4    Period Weeks    Status Achieved    Target Date 04/04/21      PT LONG TERM GOAL #3   Title Patient will increase right shoulder active flexion to >/=120 degrees for increased ease reaching overhead.    Time 4    Period Weeks    Status Achieved    Target Date 04/04/21      PT LONG TERM GOAL #4   Title Patient will increase right shoulder active abduction to >/= 120 degrees for increased ease reaching, dressing, and performing kitchen tasks.    Time 4    Period Weeks    Status Achieved    Target Date 04/04/21      PT LONG TERM GOAL #5   Title Patient will verbalize good understanding of lymphedema  risk reduction practices.    Time 4    Period Weeks    Status On-going    Target Date 05/02/21      Additional Long Term Goals   Additional Long Term Goals Yes      PT LONG TERM GOAL #6   Title Pt will have decreased swelling/soreness at lateral chest    Time 4    Period  Weeks    Status New    Target Date 05/02/21      PT LONG TERM GOAL #7   Title pt will be able to return demonstrate self MLD    Time 4    Period Weeks    Status New    Target Date 05/02/21      PT LONG TERM GOAL #8   Title Pt will have decreased tightness/discomfort from cording by 75% or greater    Time 4    Status New    Target Date 05/02/21                   Plan - 04/04/21 1154     Clinical Impression Statement Pt was reassessed today.  She has made very good progress with AROM of her right shoulder, and her ROM is nearly WNL.  She has achieved her pain goal and some of her range of motion goals. She has been more hampered the last few visits by some new more significant cording in the axillary/upper arm region.  There is still some visible swelling/soreness at the right lateral chest area. She does require review of self MLD. She will benefit from skilled PT to address cording, end range PROM, swelling at chest region, and further instruction in Manual lymph drainage.    Personal Factors and Comorbidities Finances    Stability/Clinical Decision Making Stable/Uncomplicated    Rehab Potential Excellent    PT Frequency 2x / week    PT Duration 4 weeks    PT Treatment/Interventions ADLs/Self Care Home Management;Therapeutic exercise;Patient/family education;Manual techniques;Manual lymph drainage;Passive range of motion;Scar mobilization    PT Next Visit Plan review supine scap series,cont. chip pack, progress ROM/strength, Self MLD, MFR to cording    PT Home Exercise Plan Post op shoulder ROM HEP - reviewed all previously given HEP, pulleys, Self MLD, supine scap series    Consulted and Agree with Plan of Care Patient             Patient will benefit from skilled therapeutic intervention in order to improve the following deficits and impairments:  Decreased knowledge of precautions, Postural dysfunction, Decreased range of motion, Impaired UE functional use, Pain,  Decreased scar mobility, Increased edema  Visit Diagnosis: Malignant neoplasm of lower-inner quadrant of right 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, estrogen receptor positive (HCC)  Stiffness of right shoulder, not elsewhere classified  Aftercare following surgery for neoplasm  Abnormal posture     Problem List Patient Active Problem List   Diagnosis Date Noted   Breast neoplasm, Tis (DCIS), right 02/13/2021   Genetic testing 01/31/2021   Family history of breast cancer 01/23/2021   Family history of prostate cancer 01/23/2021   Ductal carcinoma in situ (DCIS) of right breast 01/17/2021    [name] 04/04/2021, 12:09 PM  [place], [place], [address] Phone: [phone]   Fax:  [phone]  Name: Victoria Huang MRN: [id number] Date of Birth: [birth year] Victoria Huang, PT 04/04/21 12:11 PM

## 2021-04-23 ENCOUNTER — Other Ambulatory Visit: Payer: Self-pay

## 2021-04-23 ENCOUNTER — Ambulatory Visit: Payer: Medicare Other

## 2021-04-23 DIAGNOSIS — Z170 Estrogen receptor positive status [ER+]: Secondary | ICD-10-CM

## 2021-04-23 DIAGNOSIS — C50311 Malignant neoplasm of lower-inner quadrant of right female breast: Secondary | ICD-10-CM

## 2021-04-23 DIAGNOSIS — M25611 Stiffness of right shoulder, not elsewhere classified: Secondary | ICD-10-CM

## 2021-04-23 DIAGNOSIS — R293 Abnormal posture: Secondary | ICD-10-CM

## 2021-04-23 DIAGNOSIS — Z483 Aftercare following surgery for neoplasm: Secondary | ICD-10-CM

## 2021-04-23 NOTE — Therapy (Signed)
[place], [place], [address] Phone: [phone]   Fax:  [phone]  Physical Therapy Treatment  Patient Details  Name: Victoria Huang MRN: [id number] Date of Birth: 06/25/1946 Referring Provider (PT): Dr. Donnie Mesa   Encounter Date: 04/23/2021   PT End of Session - 04/23/21 1018     Visit Number 11    Number of Visits 18    Date for PT Re-Evaluation 05/02/21    PT Start Time 6378    PT Stop Time 1059    PT Time Calculation (min) 45 min    Activity Tolerance Patient tolerated treatment well    Behavior During Therapy [facility] for tasks assessed/performed             Past Medical History:  Diagnosis Date   Breast cancer ([place])    Family history of breast cancer 01/23/2021   Family history of prostate cancer 01/23/2021   High cholesterol    Hypertension     Past Surgical History:  Procedure Laterality Date   MASTECTOMY W/ SENTINEL NODE BIOPSY Right 02/13/2021   Procedure: RIGHT MASTECTOMY WITH SENTINEL LYMPH NODE BIOPSY;  Surgeon: Donnie Mesa, MD;  Location: [place];  Service: General;  Laterality: Right;    There were no vitals filed for this visit.   Subjective Assessment - 04/23/21 1014     Subjective Things are much better. Moving and using my arm better. Swelling has been better too. I am a little sore at the lateral trunk when I touch it. I have tried doing the MLD but I am not sure I am doing it right. I don't know of anything that I can't do. 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 She had a right mastectomy and sentinel node biopsy (1 negative node) on 02/13/2021.    Currently in Pain? No/denies    Pain Score 0-No pain                [facility] PT Assessment - 04/23/21 0001       Assessment   Medical Diagnosis s/p right mastectomy and SLNB    Referring Provider  (PT) Dr. Donnie Mesa    Onset Date/Surgical Date 02/13/21    Hand Dominance Right                           [facility] Adult PT Treatment/Exercise - 04/23/21 0001       Shoulder Exercises: Supine   Horizontal ABduction Strengthening;Both;10 reps    Theraband Level (Shoulder Horizontal ABduction) Level 1 (Yellow)    External Rotation Strengthening;Both;10 reps    Theraband Level (Shoulder External Rotation) Level 1 (Yellow)    Flexion Strengthening;Both;10 reps    Theraband Level (Shoulder Flexion) Level 1 (Yellow)    Diagonals Strengthening;Right;Left;10 reps    Theraband Level (Shoulder Diagonals) Level 1 (Yellow)      Manual Therapy   Edema Management assisted pt with fastening compression bra    Myofascial Release to right axillary region of cording at approx 90 and 120 deg abd.    Manual Lymphatic Drainage (MLD) reviewed MLD with pt practicing:short neck, 5 breaths, Right axillary and inguinal LN's, Right axillo-inguinal pathway, Right chest inferior to incision toward pathway, then repeating pathway and ending with LN's.    Passive ROM Right shoulder PROM flex, scaption, abd, ER with multiple VC's  to relax                          PT Long Term Goals - 04/04/21 1128       PT LONG TERM GOAL #1   Title Patient will demonstrate she has regained full shoulder ROM and function post operatively compared to baselines.    Time 4    Period Weeks    Status On-going    Target Date 05/02/21      PT LONG TERM GOAL #2   Title Patient will report pain decreased to </= 4/10 with most daily tasks.    Time 4    Period Weeks    Status Achieved    Target Date 04/04/21      PT LONG TERM GOAL #3   Title Patient will increase right shoulder active flexion to >/=120 degrees for increased ease reaching overhead.    Time 4    Period Weeks    Status Achieved    Target Date 04/04/21      PT LONG TERM GOAL #4   Title Patient will increase right shoulder active  abduction to >/= 120 degrees for increased ease reaching, dressing, and performing kitchen tasks.    Time 4    Period Weeks    Status Achieved    Target Date 04/04/21      PT LONG TERM GOAL #5   Title Patient will verbalize good understanding of lymphedema risk reduction practices.    Time 4    Period Weeks    Status On-going    Target Date 05/02/21      Additional Long Term Goals   Additional Long Term Goals Yes      PT LONG TERM GOAL #6   Title Pt will have decreased swelling/soreness at lateral chest    Time 4    Period Weeks    Status New    Target Date 05/02/21      PT LONG TERM GOAL #7   Title pt will be able to return demonstrate self MLD    Time 4    Period Weeks    Status New    Target Date 05/02/21      PT LONG TERM GOAL #8   Title Pt will have decreased tightness/discomfort from cording by 75% or greater    Time 4    Status New    Target Date 05/02/21                   Plan - 04/23/21 1037     Clinical Impression Statement Pt missed several weeks of appts secondary to no availability of appts.  She has been compliant with exs and MLD , but was not sure if she was doing it properly.  We reviewed together but she did need extensive cueing for correct sequence and technique. There continue to be multiple cords present in the axillary region and upper arm, however, ROM does seem to be improving.  There is no firmness around incision and it is difficult to tell if she has swelling now or alot of excess tissue from her surgery.    Personal Factors and Comorbidities Finances    Stability/Clinical Decision Making Stable/Uncomplicated    Rehab Potential Excellent    PT Frequency 2x / week    PT Duration 4 weeks    PT Treatment/Interventions ADLs/Self Care Home Management;Therapeutic exercise;Patient/family education;Manual techniques;Manual lymph drainage;Passive range of motion;Scar mobilization  PT Next Visit Plan review supine scap series,cont. chip  pack, progress ROM/strength, Review Self MLD, MFR to cording    PT Home Exercise Plan Post op shoulder ROM HEP - reviewed all previously given HEP, pulleys, Self MLD, supine scap series    Consulted and Agree with Plan of Care Patient             Patient will benefit from skilled therapeutic intervention in order to improve the following deficits and impairments:  Decreased knowledge of precautions, Postural dysfunction, Decreased range of motion, Impaired UE functional use, Pain, Decreased scar mobility, Increased edema  Visit Diagnosis: Malignant neoplasm of lower-inner quadrant of right 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, estrogen receptor positive (HCC)  Stiffness of right shoulder, not elsewhere classified  Aftercare following surgery for neoplasm  Abnormal posture     Problem List Patient Active Problem List   Diagnosis Date Noted   Breast neoplasm, Tis (DCIS), right 02/13/2021   Genetic testing 01/31/2021   Family history of breast cancer 01/23/2021   Family history of prostate cancer 01/23/2021   Ductal carcinoma in situ (DCIS) of right breast 01/17/2021    [name], PT 04/23/2021, [place], [place], [address] Phone: [phone]   Fax:  [phone]  Name: Victoria Huang MRN: [id number] Date of Birth: 02-04-1946

## 2021-04-25 ENCOUNTER — Ambulatory Visit: Payer: Medicare Other

## 2021-04-25 ENCOUNTER — Other Ambulatory Visit: Payer: Self-pay

## 2021-04-25 DIAGNOSIS — M25611 Stiffness of right shoulder, not elsewhere classified: Secondary | ICD-10-CM

## 2021-04-25 DIAGNOSIS — C50311 Malignant neoplasm of lower-inner quadrant of right female breast: Secondary | ICD-10-CM | POA: Diagnosis not present

## 2021-04-25 DIAGNOSIS — Z483 Aftercare following surgery for neoplasm: Secondary | ICD-10-CM

## 2021-04-25 DIAGNOSIS — Z170 Estrogen receptor positive status [ER+]: Secondary | ICD-10-CM

## 2021-04-25 DIAGNOSIS — R293 Abnormal posture: Secondary | ICD-10-CM

## 2021-04-25 NOTE — Therapy (Signed)
[place], [place], [address] Phone: [phone]   Fax:  [phone]  Physical Therapy Treatment  Patient Details  Name: Victoria Huang MRN: [id number] Date of Birth: 1945-08-08 Referring Provider (PT): Dr. Donnie Mesa   Encounter Date: 04/25/2021   PT End of Session - 04/25/21 0939     Visit Number 12    Number of Visits 18    Date for PT Re-Evaluation 05/02/21    PT Start Time 0903    PT Stop Time 0957    PT Time Calculation (min) 54 min    Activity Tolerance Patient tolerated treatment well    Behavior During Therapy [facility] for tasks assessed/performed             Past Medical History:  Diagnosis Date   Breast cancer ([place])    Family history of breast cancer 01/23/2021   Family history of prostate cancer 01/23/2021   High cholesterol    Hypertension     Past Surgical History:  Procedure Laterality Date   MASTECTOMY W/ SENTINEL NODE BIOPSY Right 02/13/2021   Procedure: RIGHT MASTECTOMY WITH SENTINEL LYMPH NODE BIOPSY;  Surgeon: Donnie Mesa, MD;  Location: [facility];  Service: General;  Laterality: Right;    There were no vitals filed for this visit.   Subjective Assessment - 04/25/21 0904     Subjective Did OK after last visit. Still sore at lateral trunk today. Practiced the MLD and I think I did OK.    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 She had a right mastectomy and sentinel node biopsy (1 negative node) on 02/13/2021.    Patient Stated Goals Get my arm back to normal    Currently in Pain? Yes    Pain Score 6     Pain Location Other (Comment)   Right  lateral trunk   Pain Orientation Right;Mid    Pain Descriptors / Indicators Tender    Pain Onset More than a month ago    Pain Frequency Intermittent    Aggravating Factors  tender when touching it.                                [place] Adult PT Treatment/Exercise - 04/25/21 0001       Shoulder Exercises: Supine   Other Supine Exercises supine on half foam roll:AROM flexion, scaption, horizontal abd x 5      Shoulder Exercises: Standing   External Rotation Strengthening;Both;10 reps    Theraband Level (Shoulder External Rotation) Level 2 (Red)    Extension Strengthening;Both;10 reps    Theraband Level (Shoulder Extension) Level 2 (Red)    Retraction Strengthening;Both;10 reps    Theraband Level (Shoulder Retraction) Level 2 (Red)    Other Standing Exercises standing jobes 1 lb 10 reps flexion and scaption    Other Standing Exercises purble ball stabs on right x 20 up and down, side to side      Shoulder Exercises: Therapy Ball   Flexion 10 reps      Manual Therapy   Soft tissue mobilization Right pect, lats, scapular area , serratus in supine and SL    Myofascial Release to right axillary region of cording at approx 90 and 120 deg abd.    Passive ROM Right shoulder PROM flex, scaption, abd, ER with multiple VC's to relax  PT Long Term Goals - 04/04/21 1128       PT LONG TERM GOAL #1   Title Patient will demonstrate she has regained full shoulder ROM and function post operatively compared to baselines.    Time 4    Period Weeks    Status On-going    Target Date 05/02/21      PT LONG TERM GOAL #2   Title Patient will report pain decreased to </= 4/10 with most daily tasks.    Time 4    Period Weeks    Status Achieved    Target Date 04/04/21      PT LONG TERM GOAL #3   Title Patient will increase right shoulder active flexion to >/=120 degrees for increased ease reaching overhead.    Time 4    Period Weeks    Status Achieved    Target Date 04/04/21      PT LONG TERM GOAL #4   Title Patient will increase right shoulder active abduction to >/= 120 degrees for increased ease reaching, dressing, and performing kitchen  tasks.    Time 4    Period Weeks    Status Achieved    Target Date 04/04/21      PT LONG TERM GOAL #5   Title Patient will verbalize good understanding of lymphedema risk reduction practices.    Time 4    Period Weeks    Status On-going    Target Date 05/02/21      Additional Long Term Goals   Additional Long Term Goals Yes      PT LONG TERM GOAL #6   Title Pt will have decreased swelling/soreness at lateral chest    Time 4    Period Weeks    Status New    Target Date 05/02/21      PT LONG TERM GOAL #7   Title pt will be able to return demonstrate self MLD    Time 4    Period Weeks    Status New    Target Date 05/02/21      PT LONG TERM GOAL #8   Title Pt will have decreased tightness/discomfort from cording by 75% or greater    Time 4    Status New    Target Date 05/02/21                   Plan - 04/25/21 0939     Clinical Impression Statement Pt continues with tenderness at the right lateral trunk, but mainly feels when she is pressing on that area and does not feel pain there otherwise.  Cording is still present but is less visible and release techniques were not as uncomfortable today.  She did very well with half foam roll exercises today and standing theraband but will require review.  Add to HEP next    Stability/Clinical Decision Making Stable/Uncomplicated    Rehab Potential Excellent    PT Frequency 2x / week    PT Duration 4 weeks    PT Treatment/Interventions ADLs/Self Care Home Management;Therapeutic exercise;Patient/family education;Manual techniques;Manual lymph drainage;Passive range of motion;Scar mobilization    PT Next Visit Plan Add standing TB to HEP after review,cont. chip pack, progress ROM/strength, Review Self MLD, MFR to cording    PT Home Exercise Plan Post op shoulder ROM HEP - reviewed all previously given HEP, pulleys, Self MLD, supine scap series    Consulted and Agree with Plan of Care Patient  Patient will  benefit from skilled therapeutic intervention in order to improve the following deficits and impairments:  Decreased knowledge of precautions, Postural dysfunction, Decreased range of motion, Impaired UE functional use, Pain, Decreased scar mobility, Increased edema  Visit Diagnosis: Malignant neoplasm of lower-inner quadrant of right 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, estrogen receptor positive (HCC)  Stiffness of right shoulder, not elsewhere classified  Aftercare following surgery for neoplasm  Abnormal posture     Problem List Patient Active Problem List   Diagnosis Date Noted   Breast neoplasm, Tis (DCIS), right 02/13/2021   Genetic testing 01/31/2021   Family history of breast cancer 01/23/2021   Family history of prostate cancer 01/23/2021   Ductal carcinoma in situ (DCIS) of right breast 01/17/2021    [name], PT 04/25/2021, 10:00 AM  [place], [place], [address] Phone: [phone]   Fax:  [phone]  Name: Victoria Huang MRN: [id number] Date of Birth: Oct 05, 1945

## 2021-04-30 ENCOUNTER — Other Ambulatory Visit: Payer: Self-pay

## 2021-04-30 ENCOUNTER — Ambulatory Visit: Payer: Medicare Other

## 2021-04-30 DIAGNOSIS — C50311 Malignant neoplasm of lower-inner quadrant of right female breast: Secondary | ICD-10-CM

## 2021-04-30 DIAGNOSIS — M25611 Stiffness of right shoulder, not elsewhere classified: Secondary | ICD-10-CM

## 2021-04-30 DIAGNOSIS — Z170 Estrogen receptor positive status [ER+]: Secondary | ICD-10-CM

## 2021-04-30 DIAGNOSIS — Z483 Aftercare following surgery for neoplasm: Secondary | ICD-10-CM

## 2021-04-30 DIAGNOSIS — R293 Abnormal posture: Secondary | ICD-10-CM

## 2021-04-30 NOTE — Patient Instructions (Addendum)
Access Code: [email or web address] URL: [email or web address] Date: 04/30/2021 Prepared by: [name]  Exercises Scapular Retraction with Resistance - 1 x daily - 2-3 x weekly - 1-2 sets - 10 reps Scapular Retraction with Resistance Advanced - 1 x daily - 2-3 x weekly - 1-2 sets - 10 reps Shoulder External Rotation and Scapular Retraction with Resistance - 1 x daily - 2-3 x weekly - 1-2 sets - 10 reps Standing Shoulder Flexion to 90 Degrees with Dumbbells - 1 x daily - 3 x weekly - 1-2 sets - 10 reps Manual Lymph Drainage for Right Breast.  Do daily.  Do slowly. Use flat hands with just enough pressure to stretch the skin. Do not slide over the skin, but move the skin with the hand you're using. Lie down or sit comfortably (in a recliner, for example) to do this.  Hug yourself:  cross arms and do circles at collar bones near neck 5-7 times (to "wake up" lots of lymph nodes in this area). Take slow deep breaths, allowing your belly to balloon out as your breathe in, 5x (to "wake up" abdominal lymph nodes to take on extra fluid). Right arm pit ; stretch skin with 5 upward circles Right groin area, at panty line--stretch skin in small circles to stimulate lymph nodes 5-7x.   Redirect fluid from right armpit toward right groin (cup your hand around the curve of your right side and do 3-4 "pumps" from armpit to groin) 3-4x down your side. Using left hand, redirect fluid from right chest toward pathway #5  Then repeat #5 above.   Then repeat #6 above.  End with repeating #3 and #4 above.   [facility] [address], [place]   [address] [phone]

## 2021-04-30 NOTE — Therapy (Signed)
[place], [place], [address] Phone: [phone]   Fax:  [phone]  Physical Therapy Treatment  Patient Details  Name: Victoria Huang MRN: [id number] Date of Birth: 13-Nov-1945 Referring Provider (PT): Dr. Donnie Mesa   Encounter Date: 04/30/2021   PT End of Session - 04/30/21 1154     PT Start Time 7619    PT Stop Time 1150    PT Time Calculation (min) 65 min    Activity Tolerance Patient tolerated treatment well    Behavior During Therapy [facility] for tasks assessed/performed             Past Medical History:  Diagnosis Date   Breast cancer ([place])    Family history of breast cancer 01/23/2021   Family history of prostate cancer 01/23/2021   High cholesterol    Hypertension     Past Surgical History:  Procedure Laterality Date   MASTECTOMY W/ SENTINEL NODE BIOPSY Right 02/13/2021   Procedure: RIGHT MASTECTOMY WITH SENTINEL LYMPH NODE BIOPSY;  Surgeon: Donnie Mesa, MD;  Location: [facility];  Service: General;  Laterality: Right;    There were no vitals filed for this visit.   Subjective Assessment - 04/30/21 1048     Subjective It feels much better than the last time I was here.  Have been doing exercises and MLD.  Swelling and soreness seems to be better. Did well after last visit.    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 She had a right mastectomy and sentinel node biopsy (1 negative node) on 02/13/2021.    Patient Stated Goals Get my arm back to normal    Currently in Pain? No/denies    Multiple Pain Sites No                               [facility] Adult PT Treatment/Exercise - 04/30/21 0001       Shoulder Exercises: Standing   External Rotation Strengthening;Both;10 reps    Theraband Level (Shoulder External Rotation) Level 2 (Red)    Extension Strengthening;Both;10  reps    Theraband Level (Shoulder Extension) Level 2 (Red)    Retraction Strengthening;Both;10 reps    Theraband Level (Shoulder Retraction) Level 2 (Red)    Other Standing Exercises standing jobes 2 lb 10 reps flexion and scaption    Other Standing Exercises purple ball stabs on right x 20 up and down, side to side      Shoulder Exercises: Therapy Ball   Flexion Both;10 reps    ABduction Right;10 reps      Manual Therapy   Myofascial Release to right axillary and upper arm region of cording at approx 90 and 120 deg abd.    Manual Lymphatic Drainage (MLD) reviewed MLD with pt practicing:short neck, 5 breaths, Right axillary and inguinal LN's, Right axillo-inguinal pathway, Right chest inferior to incision toward pathway, then repeating pathway and ending with LN's.    Passive ROM Right shoulder PROM flex, scaption, abd, ER with multiple VC's to relax                     PT Education - 04/30/21 1112     Education Details Educated in standing postural exs with theraband and standing jobes flex and scaption and added to HEP. Reviewed self MLD and gave another handout   Person(s)  Educated Patient    Methods Explanation;Demonstration;Handout    Comprehension Returned demonstration;Verbalized understanding                 PT Long Term Goals - 04/04/21 1128       PT LONG TERM GOAL #1   Title Patient will demonstrate she has regained full shoulder ROM and function post operatively compared to baselines.    Time 4    Period Weeks    Status On-going    Target Date 05/02/21      PT LONG TERM GOAL #2   Title Patient will report pain decreased to </= 4/10 with most daily tasks.    Time 4    Period Weeks    Status Achieved    Target Date 04/04/21      PT LONG TERM GOAL #3   Title Patient will increase right shoulder active flexion to >/=120 degrees for increased ease reaching overhead.    Time 4    Period Weeks    Status Achieved    Target Date 04/04/21      PT  LONG TERM GOAL #4   Title Patient will increase right shoulder active abduction to >/= 120 degrees for increased ease reaching, dressing, and performing kitchen tasks.    Time 4    Period Weeks    Status Achieved    Target Date 04/04/21      PT LONG TERM GOAL #5   Title Patient will verbalize good understanding of lymphedema risk reduction practices.    Time 4    Period Weeks    Status On-going    Target Date 05/02/21      Additional Long Term Goals   Additional Long Term Goals Yes      PT LONG TERM GOAL #6   Title Pt will have decreased swelling/soreness at lateral chest    Time 4    Period Weeks    Status New    Target Date 05/02/21      PT LONG TERM GOAL #7   Title pt will be able to return demonstrate self MLD    Time 4    Period Weeks    Status New    Target Date 05/02/21      PT LONG TERM GOAL #8   Title Pt will have decreased tightness/discomfort from cording by 75% or greater    Time 4    Status New    Target Date 05/02/21                   Plan - 04/30/21 1157     Clinical Impression Statement Pts pain and tenderness is greatly improved today.  We reviewed exercises and updated her HEP with pictures of postural theraband and jobes flexion.  We practiced self MLD and she does still require VC's and TC's to perform properly.  New instructions were also given.  She continues with several cords running from the axilla to the medial upper arm that are tender and fairly prominent at approx 120 degrees of abduction.    Stability/Clinical Decision Making Stable/Uncomplicated    Rehab Potential Excellent    PT Frequency 2x / week    PT Duration 4 weeks    PT Treatment/Interventions ADLs/Self Care Home Management;Therapeutic exercise;Patient/family education;Manual techniques;Manual lymph drainage;Passive range of motion;Scar mobilization    PT Next Visit Plan Recert/DC?,cont. chip pack, progress ROM/strength, Review Self MLD prn, MFR to cording, PROM    PT  Home Exercise Plan Post  op shoulder ROM HEP - reviewed all previously given HEP, pulleys, Self MLD, supine scap series, standing theraband, self MLD    Consulted and Agree with Plan of Care Patient             Patient will benefit from skilled therapeutic intervention in order to improve the following deficits and impairments:  Decreased knowledge of precautions, Postural dysfunction, Decreased range of motion, Impaired UE functional use, Pain, Decreased scar mobility, Increased edema  Visit Diagnosis: Malignant neoplasm of lower-inner quadrant of right 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, estrogen receptor positive (HCC)  Stiffness of right shoulder, not elsewhere classified  Aftercare following surgery for neoplasm  Abnormal posture     Problem List Patient Active Problem List   Diagnosis Date Noted   Breast neoplasm, Tis (DCIS), right 02/13/2021   Genetic testing 01/31/2021   Family history of breast cancer 01/23/2021   Family history of prostate cancer 01/23/2021   Ductal carcinoma in situ (DCIS) of right breast 01/17/2021    [name], PT 04/30/2021, 12:03 PM  [place], [place], [address] Phone: [phone]   Fax:  [phone]  Name: Victoria Huang MRN: [id number] Date of Birth: Dec 30, 1945

## 2021-05-07 ENCOUNTER — Other Ambulatory Visit: Payer: Self-pay

## 2021-05-07 ENCOUNTER — Ambulatory Visit: Payer: Medicare Other | Attending: Surgery

## 2021-05-07 DIAGNOSIS — Z483 Aftercare following surgery for neoplasm: Secondary | ICD-10-CM | POA: Diagnosis present

## 2021-05-07 DIAGNOSIS — C50311 Malignant neoplasm of lower-inner quadrant of right female breast: Secondary | ICD-10-CM | POA: Insufficient documentation

## 2021-05-07 DIAGNOSIS — Z170 Estrogen receptor positive status [ER+]: Secondary | ICD-10-CM | POA: Insufficient documentation

## 2021-05-07 DIAGNOSIS — M25611 Stiffness of right shoulder, not elsewhere classified: Secondary | ICD-10-CM | POA: Diagnosis present

## 2021-05-07 DIAGNOSIS — R293 Abnormal posture: Secondary | ICD-10-CM | POA: Insufficient documentation

## 2021-05-07 NOTE — Therapy (Signed)
[facility] @ [place], [place], [address] Phone:     Fax:     Physical Therapy Treatment  Patient Details  Name: Victoria Huang MRN: [id number] Date of Birth: 10/01/1945 Referring Provider (PT): Dr. Donnie Mesa   Encounter Date: 05/07/2021   PT End of Session - 05/07/21 1743     Visit Number 14    Number of Visits 18    Date for PT Re-Evaluation 06/04/21    PT Start Time 1400    PT Stop Time 1456    PT Time Calculation (min) 56 min    Activity Tolerance Patient tolerated treatment well    Behavior During Therapy [facility] for tasks assessed/performed             Past Medical History:  Diagnosis Date   Breast cancer ([place])    Family history of breast cancer 01/23/2021   Family history of prostate cancer 01/23/2021   High cholesterol    Hypertension     Past Surgical History:  Procedure Laterality Date   MASTECTOMY W/ SENTINEL NODE BIOPSY Right 02/13/2021   Procedure: RIGHT MASTECTOMY WITH SENTINEL LYMPH NODE BIOPSY;  Surgeon: Donnie Mesa, MD;  Location: [facility];  Service: General;  Laterality: Right;    There were no vitals filed for this visit.   Subjective Assessment - 05/07/21 1402     Subjective Everything is getting close to normal.  It feels much better than it has in the past.  I can reach alot better now without pain. Can't think of any real limitations. 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 She had a right mastectomy and sentinel node biopsy (1 negative node) on 02/13/2021.    Patient Stated Goals Get my arm back to normal    Currently in Pain? No/denies    Pain Score 0-No pain                [facility] PT Assessment - 05/07/21 0001       Assessment   Medical Diagnosis s/p right  mastectomy and SLNB    Referring Provider (PT) Dr. Donnie Mesa    Onset Date/Surgical Date 02/13/21    Hand Dominance Right      Prior Function   Level of Independence Independent      Observation/Other Assessments   Observations Pt continues with areas at anterior and lateral chest with what appears to be swelling however, it does not continue to change and may just be some extra tissue.      AROM   AROM Assessment Site Shoulder    Right Shoulder Flexion 150 Degrees    Right Shoulder ABduction 159 Degrees    Right Shoulder External Rotation 100 Degrees                L-DEX FLOWSHEETS - 05/07/21 1400       L-DEX LYMPHEDEMA SCREENING   Measurement Type Unilateral    L-DEX MEASUREMENT EXTREMITY Upper Extremity    POSITION  Seated    DOMINANT SIDE Left    At Risk Side Left    BASELINE SCORE (UNILATERAL) -2    L-DEX SCORE (UNILATERAL) -0.5    VALUE CHANGE (UNILAT) 1.5  [facility] Adult PT Treatment/Exercise - 05/07/21 0001       Shoulder Exercises: Seated   Other Seated Exercises Supine wand flexion, scaption,stargazer x 5      Manual Therapy   Myofascial Release to right axillary and upper arm region of cording at approx 90 and 120 deg abd.    Passive ROM Right shoulder PROM flex, scaption, abd, ER with multiple VC's to relax                          PT Long Term Goals - 05/07/21 1416       PT LONG TERM GOAL #1   Title Patient will demonstrate she has regained full shoulder ROM and function post operatively compared to baselines.    Time 4    Period Weeks    Status Achieved    Target Date 05/07/21      PT LONG TERM GOAL #2   Title Patient will report pain decreased to </= 4/10 with most daily tasks.    Period Weeks    Status Achieved    Target Date 05/07/21      PT LONG TERM GOAL #3   Title Patient will increase right shoulder active flexion to >/=120 degrees for increased ease reaching overhead.     Period Weeks    Status Achieved      PT LONG TERM GOAL #4   Title Patient will increase right shoulder active abduction to >/= 120 degrees for increased ease reaching, dressing, and performing kitchen tasks.    Time 4    Period Weeks    Status Achieved      PT LONG TERM GOAL #5   Title Patient will verbalize good understanding of lymphedema risk reduction practices.    Time 4    Period Weeks    Status On-going    Target Date 06/04/21      PT LONG TERM GOAL #6   Title Pt will have decreased swelling/soreness at lateral chest    Time 4    Period Weeks    Status Achieved    Target Date 05/07/21      PT LONG TERM GOAL #7   Title pt will be able to return demonstrate self MLD    Time 4    Period Weeks    Status On-going    Target Date 06/04/21      PT LONG TERM GOAL #8   Title Pt will have decreased tightness/discomfort from cording by 75% or greater    Time 4    Period Weeks    Status Achieved    Target Date 05/07/21                   Plan - 05/07/21 1744     Clinical Impression Statement Pt was reassessed today.  She demonstrates excellent improvement in shoulder ROM, and good decreases in pain.  She is very compliant with her HEP, and with performing MLD. Her SOZO screen performed today was very good and well within normal limits.  She continues to be bothered somewhat by cording in the right axillary region into the medial upper arm.  They do not appear to interfere with her motion.  We reviewed precautions for lymphedema and she does require some cueing still.  She has not yet attended the [facility] class and this will be arranged for her again to take place on October 17th. She will be on hold for 2 weeks  and will continue to work on her stretches.  She will return for follow up and assessment to see how the cording is doing and if it is no worse she will be discharged to a HEP. She is in agreement.    Personal Factors and Comorbidities Finances    Stability/Clinical  Decision Making Stable/Uncomplicated    Rehab Potential Excellent    PT Frequency 1x / week    PT Duration 4 weeks    PT Treatment/Interventions ADLs/Self Care Home Management;Therapeutic exercise;Patient/family education;Manual techniques;Manual lymph drainage;Passive range of motion;Scar mobilization    PT Next Visit Plan Reassess cording, discuss precautions again, check goals, DC if pt continues to do well    PT Home Exercise Plan Post op shoulder ROM HEP - reviewed all previously given HEP, pulleys, Self MLD, supine scap series, standing theraband, self MLD    Consulted and Agree with Plan of Care Patient             Patient will benefit from skilled therapeutic intervention in order to improve the following deficits and impairments:  Decreased knowledge of precautions, Postural dysfunction, Decreased range of motion, Impaired UE functional use, Pain, Decreased scar mobility, Increased edema  Visit Diagnosis: Malignant neoplasm of lower-inner quadrant of right 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, estrogen receptor positive (HCC)  Stiffness of right shoulder, not elsewhere classified  Aftercare following surgery for neoplasm  Abnormal posture     Problem List Patient Active Problem List   Diagnosis Date Noted   Breast neoplasm, Tis (DCIS), right 02/13/2021   Genetic testing 01/31/2021   Family history of breast cancer 01/23/2021   Family history of prostate cancer 01/23/2021   Ductal carcinoma in situ (DCIS) of right breast 01/17/2021    [name], PT 05/07/2021, 5:52 PM  [facility] @ [place], [place], [address] Phone:     Fax:     Name: Victoria Huang MRN: [id number] Date of Birth: [birth year]

## 2021-05-09 ENCOUNTER — Ambulatory Visit: Payer: Medicare Other | Admitting: Physical Therapy

## 2021-05-21 ENCOUNTER — Ambulatory Visit: Payer: Medicare Other

## 2021-05-21 ENCOUNTER — Other Ambulatory Visit: Payer: Self-pay

## 2021-05-21 DIAGNOSIS — M25611 Stiffness of right shoulder, not elsewhere classified: Secondary | ICD-10-CM

## 2021-05-21 DIAGNOSIS — R293 Abnormal posture: Secondary | ICD-10-CM

## 2021-05-21 DIAGNOSIS — C50311 Malignant neoplasm of lower-inner quadrant of right female breast: Secondary | ICD-10-CM

## 2021-05-21 DIAGNOSIS — Z483 Aftercare following surgery for neoplasm: Secondary | ICD-10-CM

## 2021-05-21 DIAGNOSIS — Z170 Estrogen receptor positive status [ER+]: Secondary | ICD-10-CM

## 2021-05-21 NOTE — Therapy (Signed)
[facility] @ [place], [place], [address] Phone: [phone]   Fax:  [phone]  Physical Therapy Treatment  Patient Details  Name: Victoria Huang MRN: [id number] Date of Birth: 1945/08/29 Referring Provider (PT): Dr. Donnie Mesa   Encounter Date: 05/21/2021   PT End of Session - 05/21/21 1351     Visit Number 15    Number of Visits 18    Date for PT Re-Evaluation 06/04/21    PT Start Time 1304    PT Stop Time 7530    PT Time Calculation (min) 45 min    Activity Tolerance Patient tolerated treatment well    Behavior During Therapy [facility] for tasks assessed/performed             Past Medical History:  Diagnosis Date   Breast cancer ([place])    Family history of breast cancer 01/23/2021   Family history of prostate cancer 01/23/2021   High cholesterol    Hypertension     Past Surgical History:  Procedure Laterality Date   MASTECTOMY W/ SENTINEL NODE BIOPSY Right 02/13/2021   Procedure: RIGHT MASTECTOMY WITH SENTINEL LYMPH NODE BIOPSY;  Surgeon: Donnie Mesa, MD;  Location: [facility];  Service: General;  Laterality: Right;    There were no vitals filed for this visit.   Subjective Assessment - 05/21/21 1305     Subjective The cording is doing well.  It is not interfering with my motion at all.   I am moving apartments and I have been moving boxes without any problem. I can't think of any limitations. I think I am ready to be discharged. I missed the ABC class again.    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 She had a right mastectomy and sentinel node biopsy (1 negative node) on 02/13/2021.    Patient Stated Goals Get my arm back to normal    Currently in Pain? No/denies    Pain Score 0-No pain                [facility] PT Assessment - 05/21/21 0001       Assessment   Medical Diagnosis s/p right  mastectomy and SLNB    Referring Provider (PT) Dr. Donnie Mesa    Onset Date/Surgical Date 02/13/21    Hand Dominance Right      Balance Screen   Has the patient fallen in the past 6 months No    Has the patient had a decrease in activity level because of a fear of falling?  No    Is the patient reluctant to leave their home because of a fear of falling?  No      Prior Function   Level of Independence Independent      Observation/Other Assessments   Observations Pt continues with areas at anterior and lateral chest with what appears to be swelling however, it does not continue to change and may just be some extra tissue.      AROM   Right Shoulder Extension 60 Degrees    Right Shoulder Flexion 150 Degrees    Right Shoulder ABduction 165 Degrees    Right Shoulder External Rotation 97 Degrees               LYMPHEDEMA/ONCOLOGY QUESTIONNAIRE - 05/21/21 0001       Type   Cancer Type Right breast cancer  Surgeries   Mastectomy Date 02/13/21    Sentinel Lymph Node Biopsy Date 02/13/21    Number Lymph Nodes Removed 1      Lymphedema Assessments   Lymphedema Assessments Upper extremities      Right Upper Extremity Lymphedema   10 cm Proximal to Olecranon Process 29.6 cm    Olecranon Process 25.9 cm    10 cm Proximal to Ulnar Styloid Process 21.5 cm    Just Proximal to Ulnar Styloid Process 16.1 cm    Across Hand at [employer] 20.1 cm    At [place] of 2nd Digit 6.3 cm                                      PT Long Term Goals - 05/21/21 1334       PT LONG TERM GOAL #1   Title Patient will demonstrate she has regained full shoulder ROM and function post operatively compared to baselines.    Period Weeks    Status Achieved      PT LONG TERM GOAL #2   Title Patient will report pain decreased to </= 4/10 with most daily tasks.    Baseline no pain with ADL's    Time 4    Period Weeks    Status Achieved      PT LONG TERM GOAL #3    Title Patient will increase right shoulder active flexion to >/=120 degrees for increased ease reaching overhead.    Time 4    Period Weeks    Status Achieved      PT LONG TERM GOAL #4   Title Patient will increase right shoulder active abduction to >/= 120 degrees for increased ease reaching, dressing, and performing kitchen tasks.    Time 4    Period Weeks    Status Achieved      PT LONG TERM GOAL #5   Title Patient will verbalize good understanding of lymphedema risk reduction practices.    Time 4    Status achieved     PT LONG TERM GOAL #6   Title Pt will have decreased swelling/soreness at lateral chest    Time 4    Period Weeks    Status Achieved      PT LONG TERM GOAL #7   Title pt will be able to return demonstrate self MLD    Time 4    Status Achieved      PT LONG TERM GOAL #8   Title Pt will have decreased tightness/discomfort from cording by 75% or greater    Time 4    Period Weeks    Status Achieved                   Plan - 05/21/21 1352     Clinical Impression Statement Pt was reasssessed today.  She continues with excellent shoulder ROM and no difference in circumference arm measurements.  She is compliant with her HEP.  Her SOZO screen done 2 weeks ago was well within normal limits.  She does still have some very mild cording in the right axillary region and upper arm which does not intefere with her motion or bother her.  She was set up for another ABC class because she keeps forgetting it.  We did review precautions for lymphedema again, and she has decided she would like a compression sleeve.  She was given a  script and instructions to call for an appt. She is ready for discharge.    Personal Factors and Comorbidities Finances    Stability/Clinical Decision Making Stable/Uncomplicated    Rehab Potential Excellent    PT Frequency 1x / week    PT Treatment/Interventions ADLs/Self Care Home Management;Therapeutic exercise;Patient/family  education;Manual techniques;Manual lymph drainage;Passive range of motion;Scar mobilization    PT Next Visit Plan DC to HEP    PT Home Exercise Plan Post op shoulder ROM HEP - reviewed all previously given HEP, pulleys, Self MLD, supine scap series, standing theraband, self MLD    Consulted and Agree with Plan of Care Patient             Patient will benefit from skilled therapeutic intervention in order to improve the following deficits and impairments:  Decreased knowledge of precautions, Postural dysfunction, Decreased range of motion, Impaired UE functional use, Pain, Decreased scar mobility, Increased edema  Visit Diagnosis: Stiffness of right shoulder, not elsewhere classified  Malignant neoplasm of lower-inner quadrant of right 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, estrogen receptor positive ([place])  Aftercare following surgery for neoplasm  Abnormal posture     Problem List Patient Active Problem List   Diagnosis Date Noted   Breast neoplasm, Tis (DCIS), right 02/13/2021   Genetic testing 01/31/2021   Family history of breast cancer 01/23/2021   Family history of prostate cancer 01/23/2021   Ductal carcinoma in situ (DCIS) of right breast 01/17/2021   PHYSICAL THERAPY DISCHARGE SUMMARY  Visits from Start of Care: 15  Current functional level related to goals / functional outcomes:achieved all goals   Remaining deficits: Pt to attend ABC class   Education / Equipment: theraband   Patient agrees to discharge. Patient goals were met. Patient is being discharged due to meeting the stated rehab goals.   [name], PT 05/21/2021, 1:56 PM  [facility] @ [place], [place], [address] Phone: [phone]   Fax:  [phone]  Name: Victoria Huang MRN: [id number] Date of Birth: 14-Apr-1946

## 2021-06-14 ENCOUNTER — Telehealth: Payer: Self-pay | Admitting: *Deleted

## 2021-06-17 ENCOUNTER — Ambulatory Visit: Payer: Self-pay

## 2021-06-19 ENCOUNTER — Encounter: Payer: Self-pay | Admitting: General Practice

## 2021-06-19 ENCOUNTER — Encounter: Payer: Self-pay | Admitting: Adult Health

## 2021-06-19 ENCOUNTER — Other Ambulatory Visit: Payer: Self-pay

## 2021-06-19 ENCOUNTER — Inpatient Hospital Stay: Payer: Medicare Other | Attending: Adult Health | Admitting: Adult Health

## 2021-06-19 VITALS — BP 137/66 | HR 79 | Temp 97.2°F | Resp 18 | Ht 63.0 in | Wt 156.0 lb

## 2021-06-19 DIAGNOSIS — D0511 Intraductal carcinoma in situ of right breast: Secondary | ICD-10-CM | POA: Diagnosis not present

## 2021-06-19 DIAGNOSIS — Z79810 Long term (current) use of selective estrogen receptor modulators (SERMs): Secondary | ICD-10-CM | POA: Diagnosis not present

## 2021-06-19 DIAGNOSIS — Z170 Estrogen receptor positive status [ER+]: Secondary | ICD-10-CM | POA: Insufficient documentation

## 2021-06-19 DIAGNOSIS — Z1231 Encounter for screening mammogram for malignant neoplasm of breast: Secondary | ICD-10-CM | POA: Diagnosis not present

## 2021-06-19 NOTE — Progress Notes (Signed)
[facility] CSW Progress Notes  Called patient at request of nurse practitioner re concerns about mold in the home.  Landlord has refused to fix mold.  Patient is moving out shortly.  She has rented another place.  Thus the concerns about mold in her current residence are unable to be addressed.  Edwyna Shell, LCSW Clinical Social Worker Phone:  [phone]

## 2021-06-19 NOTE — Progress Notes (Signed)
SURVIVORSHIP VISIT:   BRIEF ONCOLOGIC HISTORY:  Oncology History  Ductal carcinoma in situ (DCIS) of right breast  01/17/2021 Initial Diagnosis   Screening mammogram detected right breast asymmetry and calcifications.  2 areas were detected 4 o'clock position 2.5 cm calcifications, LIQ: 9.1 cm, classifications.  Ultrasound-guided biopsy revealed high-grade DCIS with calcifications and necrosis ER 100%, PR 100%   02/04/2021 Genetic Testing   Negative hereditary cancer genetic testing: no pathogenic variants detected in [facility] CancerNext-Expanded +RNAinsight Panel.  The report date is February 04, 2021.    The CustomNext-Cancer+RNAinsight panel offered by [facility] includes sequencing and rearrangement analysis for the following 47 genes:  APC, ATM, AXIN2, BARD1, BMPR1A, BRCA1, BRCA2, BRIP1, CDH1, CDK4, CDKN2A, CHEK2, DICER1, EPCAM, GREM1, HOXB13, MEN1, MLH1, MSH2, MSH3, MSH6, MUTYH, NBN, NF1, NF2, NTHL1, PALB2, PMS2, POLD1, POLE, PTEN, RAD51C, RAD51D, RECQL, RET, SDHA, SDHAF2, SDHB, SDHC, SDHD, SMAD4, SMARCA4, STK11, TP53, TSC1, TSC2, and VHL.  RNA data is routinely analyzed for use in variant interpretation for all genes.    02/13/2021 Surgery   Mastectomy of the right breast with Dr. Georgette Dover   Pathology: 1.8 cm intermediate to high grade DCIS with necrosis and calcifications, resection margins negative for DCIS, and axillary lymph node negative for carcinoma   02/13/2021 Cancer Staging   Staging form: Breast, AJCC 8th Edition - Pathologic stage from 02/13/2021: Stage 0 (pTis (DCIS), pN0, cM0) - Signed by Gardenia Phlegm, NP on 06/19/2021    03/2021 -  Anti-estrogen oral therapy   Tamoxifen daily     INTERVAL HISTORY:  Ms. Nored to review her survivorship care plan detailing her treatment course for breast cancer, as well as monitoring long-term side effects of that treatment, education regarding health maintenance, screening, and overall wellness and health promotion.     Overall,  Ms. Weddington reports feeling quite well today.  She continues on tamoxifen with good tolerance.  She was previously swimming and now is walking.  Overall she is doing quite well.  She does note on her social determinants discussion that her house has mold in it and that her landlord is not fixing it.  She is moving out this weekend.  REVIEW OF SYSTEMS:  Review of Systems  Constitutional:  Negative for appetite change, chills, fatigue, fever and unexpected weight change.  HENT:   Negative for hearing loss, lump/mass and trouble swallowing.   Eyes:  Negative for eye problems and icterus.  Respiratory:  Negative for chest tightness, cough and shortness of breath.   Cardiovascular:  Negative for chest pain, leg swelling and palpitations.  Gastrointestinal:  Negative for abdominal distention, abdominal pain, constipation, diarrhea, nausea and vomiting.  Endocrine: Negative for hot flashes.  Genitourinary:  Negative for difficulty urinating.   Musculoskeletal:  Negative for arthralgias.  Skin:  Negative for itching and rash.  Neurological:  Negative for dizziness, extremity weakness, headaches and numbness.  Hematological:  Negative for adenopathy. Does not bruise/bleed easily.  Psychiatric/Behavioral:  Negative for depression. The patient is not nervous/anxious.   Breast: Denies any new nodularity, masses, tenderness, nipple changes, or nipple discharge.      ONCOLOGY TREATMENT TEAM:  1. Surgeon:  Dr. Georgette Dover at [facility] 2. Medical Oncologist: Dr. Lindi Adie  3. Radiation Oncologist: Dr. Lisbeth Renshaw    PAST MEDICAL/SURGICAL HISTORY:  Past Medical History:  Diagnosis Date   Breast cancer [facility])    Family history of breast cancer 01/23/2021   Family history of prostate cancer 01/23/2021   High cholesterol    Hypertension  Past Surgical History:  Procedure Laterality Date   MASTECTOMY W/ SENTINEL NODE BIOPSY Right 02/13/2021   Procedure: RIGHT MASTECTOMY WITH SENTINEL LYMPH NODE  BIOPSY;  Surgeon: Donnie Mesa, MD;  Location: [place];  Service: General;  Laterality: Right;     ALLERGIES:  Allergies  Allergen Reactions   Other     Kiwi tea causes dizziness      CURRENT MEDICATIONS:  Outpatient Encounter Medications as of 06/19/2021  Medication Sig   amLODipine (NORVASC) 5 MG tablet Take 1 tablet by mouth at bedtime.   rosuvastatin (CRESTOR) 20 MG tablet Take 20 mg by mouth at bedtime.   tamoxifen (NOLVADEX) 20 MG tablet Take 1 tablet (20 mg total) by mouth daily.   oxyCODONE (OXY IR/ROXICODONE) 5 MG immediate release tablet Take 1 tablet (5 mg total) by mouth every 6 (six) hours as needed for severe pain. (Patient not taking: Reported on 06/19/2021)   No facility-administered encounter medications on file as of 06/19/2021.     ONCOLOGIC FAMILY HISTORY:  Family History  Problem Relation Age of Onset   Lung cancer Father        dx 21s; smoking hx   Breast cancer Sister 48   Prostate cancer Brother        dx unknown age   34 Brother        unknown type; dx after 2   Breast cancer Daughter 68     GENETIC COUNSELING/TESTING: See above  SOCIAL HISTORY:  Social History   Socioeconomic History   Marital status: Divorced    Spouse name: Not on file   Number of children: Not on file   Years of education: Not on file   Highest education level: Not on file  Occupational History   Not on file  Tobacco Use   Smoking status: Never   Smokeless tobacco: Never  Vaping Use   Vaping Use: Never used  Substance and Sexual Activity   Alcohol use: Yes    Comment: social   Drug use: Never   Sexual activity: Not on file  Other Topics Concern   Not on file  Social History Narrative   Not on file   Social Determinants of Health   Financial Resource Strain: Low Risk    Difficulty of Paying Living Expenses: Not very hard  Food Insecurity: No Food Insecurity   Worried About Running Out of Food in the Last Year: Never true    [place] in the Last Year: Never true  Transportation Needs: No Transportation Needs   Lack of Transportation (Medical): No   Lack of Transportation (Non-Medical): No  Physical Activity: Sufficiently Active   Days of Exercise per Week: 4 days   Minutes of Exercise per Session: 60 min  Stress: Stress Concern Present   Feeling of Stress : To some extent  Social Connections: Socially Isolated   Frequency of Communication with Friends and Family: Three times a week   Frequency of Social Gatherings with Friends and Family: Once a week   Attends Religious Services: Never   Marine scientist or Organizations: No   Attends Music therapist: Never   Marital Status: Divorced  Human resources officer Violence: Not At Risk   Fear of Current or Ex-Partner: No   Emotionally Abused: No   Physically Abused: No   Sexually Abused: No     OBSERVATIONS/OBJECTIVE:  BP 137/66 (BP Location: Left Arm, Patient Position: Sitting)   Pulse 79   Temp (!)  97.2 F (36.2 C) (Temporal)   Resp 18   Ht 5' 3" (1.6 m)   Wt 156 lb (70.8 kg)   SpO2 100%   BMI 27.63 kg/m  GENERAL: Patient is a well appearing female in no acute distress HEENT:  Sclerae anicteric.  Oropharynx clear and moist. No ulcerations or evidence of oropharyngeal candidiasis. Neck is supple.  NODES:  No cervical, supraclavicular, or axillary lymphadenopathy palpated.  BREAST EXAM: Status post right breast mastectomy no sign of local recurrence left breast is benign. LUNGS:  Clear to auscultation bilaterally.  No wheezes or rhonchi. HEART:  Regular rate and rhythm. No murmur appreciated. ABDOMEN:  Soft, nontender.  Positive, normoactive bowel sounds. No organomegaly palpated. MSK:  No focal spinal tenderness to palpation. Full range of motion bilaterally in the upper extremities. EXTREMITIES:  No peripheral edema.   SKIN:  Clear with no obvious rashes or skin changes. No nail dyscrasia. NEURO:  Nonfocal. Well oriented.   Appropriate affect.   LABORATORY DATA:  None for this visit.  DIAGNOSTIC IMAGING:  None for this visit.      ASSESSMENT AND PLAN:  Ms.. Laidler is a pleasant 75 y.o. female with Stage 0 right breast DCIS ER+/PR+/HER2-, diagnosed in 01/2021, treated with mastectomy and antiestrogen therapy with tamoxifen beginning in August 2022.  She presents to the Survivorship Clinic for our initial meeting and routine follow-up post-completion of treatment for breast cancer.    1. Stage 0 right breast cancer:  Ms. Quijas is continuing to recover from definitive treatment for breast cancer. She will follow-up with her medical oncologist, Dr. Lindi Adie in 6 months with history and physical exam per surveillance protocol.  She will continue her anti-estrogen therapy with tamoxifen. Thus far, she is tolerating the tamoxifen well, with minimal side effects. She was instructed to make Dr. Lindi Adie or myself aware if she begins to experience any worsening side effects of the medication and I could see her back in clinic to help manage those side effects, as needed. Her mammogram is due May 2023; orders placed today. Today, a comprehensive survivorship care plan and treatment summary was reviewed with the patient today detailing her breast cancer diagnosis, treatment course, potential late/long-term effects of treatment, appropriate follow-up care with recommendations for the future, and patient education resources.  A copy of this summary, along with a letter will be sent to the patient's primary care provider via mail/fax/In Basket message after today's visit.    2. Housing issues.  I placed a referral to social work to discuss further.  3. Bone health:    She was given education on specific activities to promote bone health.  4. Cancer screening:  Due to Ms. Krock's history and her age, she should receive screening for skin cancers, colon cancer.  The information and recommendations are listed on the patient's  comprehensive care plan/treatment summary and were reviewed in detail with the patient.    5. Health maintenance and wellness promotion: Ms. Chopra was encouraged to consume 5-7 servings of fruits and vegetables per day. We reviewed the "Nutrition Rainbow" handout  She was also encouraged to engage in moderate to vigorous exercise for 30 minutes per day most days of the week. We discussed the [employer] fitness program, which is designed for cancer survivors to help them become more physically fit after cancer treatments.  She was instructed to limit her alcohol consumption and continue to abstain from tobacco use.     6. Support services/counseling: It is not uncommon  for this period of the patient's cancer care trajectory to be one of many emotions and stressors. She was given information regarding our available services and encouraged to contact me with any questions or for help enrolling in any of our support group/programs.    Follow up instructions:    -Return to cancer center 02/2022 for follow-up with Dr. Payton Mccallum -Mammogram due in May 2023 -Follow up with Dr. Georgette Dover 08/2021 -She is welcome to return back to the Survivorship Clinic at any time; no additional follow-up needed at this time.  -Consider referral back to survivorship as a long-term survivor for continued surveillance  The patient was provided an opportunity to ask questions and all were answered. The patient agreed with the plan and demonstrated an understanding of the instructions.    Total encounter time: 45 minutes in face-to-face visit time, chart review, lab review, order entry, care coordination, and documentation of the encounter.  Wilber Bihari, NP 06/19/21 12:43 PM Medical Oncology and Hematology [facility] [place], [place] [address] Tel. [phone]    Fax. [phone]  *Total Encounter Time as defined by the Centers for Medicare and Medicaid Services includes, in addition to  the face-to-face time of a patient visit (documented in the note above) non-face-to-face time: obtaining and reviewing outside history, ordering and reviewing medications, tests or procedures, care coordination (communications with other health care professionals or caregivers) and documentation in the medical record.

## 2021-07-03 ENCOUNTER — Encounter (HOSPITAL_COMMUNITY): Payer: Self-pay | Admitting: Emergency Medicine

## 2021-07-03 ENCOUNTER — Other Ambulatory Visit: Payer: Self-pay

## 2021-07-03 ENCOUNTER — Ambulatory Visit (HOSPITAL_COMMUNITY)
Admission: EM | Admit: 2021-07-03 | Discharge: 2021-07-03 | Disposition: A | Discharge status: Home / Self Care | Payer: Medicare Other | Attending: Emergency Medicine | Admitting: Emergency Medicine

## 2021-07-03 DIAGNOSIS — U071 COVID-19: Secondary | ICD-10-CM | POA: Diagnosis not present

## 2021-07-03 DIAGNOSIS — J029 Acute pharyngitis, unspecified: Secondary | ICD-10-CM | POA: Insufficient documentation

## 2021-07-03 DIAGNOSIS — R051 Acute cough: Secondary | ICD-10-CM | POA: Insufficient documentation

## 2021-07-03 MED ORDER — PREDNISONE 10 MG (21) PO TBPK: ORAL_TABLET | Freq: Every day | ORAL | 0 refills | Status: DC

## 2021-07-03 MED ORDER — ALBUTEROL SULFATE HFA 108 (90 BASE) MCG/ACT IN AERS: 1.0000 | INHALATION_SPRAY | Freq: Four times a day (QID) | RESPIRATORY_TRACT | 0 refills | Status: AC | PRN

## 2021-07-03 NOTE — ED Provider Notes (Signed)
[facility]    CSN: [id number] Arrival date & time: 07/03/21  1022      History   Chief Complaint Chief Complaint  Patient presents with   Cough   Sore Throat    HPI Victoria Huang is a 75 y.o. female.   Pt was at a funeral this past weekend. Began to have a sore throat on Monday. Her work gave her an at home coivd test and it was positive. Pt wanted another test completed to ensure it was correct. Pt has a slight cough and slight sore throat. Denies any sob, no n/v/d. Has had both vaccine and booster. Pt called her pcp and they called in antiviral meds for pt. She has not started them yet.    Past Medical History:  Diagnosis Date   Breast cancer ([place])    Family history of breast cancer 01/23/2021   Family history of prostate cancer 01/23/2021   High cholesterol    Hypertension     Patient Active Problem List   Diagnosis Date Noted   Breast neoplasm, Tis (DCIS), right 02/13/2021   Genetic testing 01/31/2021   Family history of breast cancer 01/23/2021   Family history of prostate cancer 01/23/2021   Ductal carcinoma in situ (DCIS) of right breast 01/17/2021    Past Surgical History:  Procedure Laterality Date   MASTECTOMY W/ SENTINEL NODE BIOPSY Right 02/13/2021   Procedure: RIGHT MASTECTOMY WITH SENTINEL LYMPH NODE BIOPSY;  Surgeon: Donnie Mesa, MD;  Location: [place];  Service: General;  Laterality: Right;    OB History   No obstetric history on file.      Home Medications    Prior to Admission medications   Medication Sig Start Date End Date Taking? Authorizing Provider  albuterol (VENTOLIN HFA) 108 (90 Base) MCG/ACT inhaler Inhale 1-2 puffs into the lungs every 6 (six) hours as needed for wheezing or shortness of breath. 07/03/21  Yes Marney Setting, NP  predniSONE (STERAPRED UNI-PAK 21 TAB) 10 MG (21) TBPK tablet Take by mouth daily. Take 6 tabs by mouth daily  for 2 days, then 5 tabs for 2 days, then 4 tabs for 2  days, then 3 tabs for 2 days, 2 tabs for 2 days, then 1 tab by mouth daily for 2 days 07/03/21  Yes Morley Kos L, NP  amLODipine (NORVASC) 5 MG tablet Take 1 tablet by mouth at bedtime. 12/17/20   [provider]  oxyCODONE (OXY IR/ROXICODONE) 5 MG immediate release tablet Take 1 tablet (5 mg total) by mouth every 6 (six) hours as needed for severe pain. Patient not taking: Reported on 06/19/2021 02/14/21   Donnie Mesa, MD  rosuvastatin (CRESTOR) 20 MG tablet Take 20 mg by mouth at bedtime. 01/04/21   [provider]  tamoxifen (NOLVADEX) 20 MG tablet Take 1 tablet (20 mg total) by mouth daily. 03/19/21   Nicholas Lose, MD    Family History Family History  Problem Relation Age of Onset   Lung cancer Father        dx 22s; smoking hx   Breast cancer Sister 49   Prostate cancer Brother        dx unknown age   9 Brother        unknown type; dx after 62   Breast cancer Daughter 34    Social History Social History   Tobacco Use   Smoking status: Never   Smokeless tobacco: Never  Vaping Use   Vaping Use:  Never used  Substance Use Topics   Alcohol use: Yes    Comment: social   Drug use: Never     Allergies   Other   Review of Systems Review of Systems  Constitutional:  Negative for activity change, chills and fever.  HENT:  Positive for sore throat. Negative for congestion, postnasal drip, rhinorrhea, sinus pressure and sinus pain.   Eyes: Negative.   Respiratory:  Positive for cough. Negative for shortness of breath.   Cardiovascular: Negative.   Gastrointestinal: Negative.  Negative for diarrhea, nausea and vomiting.  Genitourinary: Negative.   Musculoskeletal: Negative.   Skin: Negative.   Neurological: Negative.     Physical Exam Triage Vital Signs ED Triage Vitals  Enc Vitals Group     BP 07/03/21 1153 (!) 167/91     Pulse Rate 07/03/21 1153 80     Resp 07/03/21 1153 17     Temp 07/03/21 1153 99 F (37.2 C)     Temp Source  07/03/21 1153 Oral     SpO2 07/03/21 1153 97 %     Weight --      Height --      Head Circumference --      Peak Flow --      Pain Score 07/03/21 1149 0     Pain Loc --      Pain Edu? --      Excl. in [place]? --    No data found.  Updated Vital Signs BP (!) 167/91 (BP Location: Right Arm)   Pulse 80   Temp 99 F (37.2 C) (Oral)   Resp 17   SpO2 97%   Visual Acuity Right Eye Distance:   Left Eye Distance:   Bilateral Distance:    Right Eye Near:   Left Eye Near:    Bilateral Near:     Physical Exam Constitutional:      Appearance: She is well-developed.  HENT:     Right Ear: Tympanic membrane normal.     Left Ear: Tympanic membrane normal.     Nose: Congestion present.     Mouth/Throat:     Mouth: Mucous membranes are moist.     Pharynx: Posterior oropharyngeal erythema present.     Tonsils: No tonsillar exudate or tonsillar abscesses. 0 on the right. 0 on the left.  Eyes:     Conjunctiva/sclera: Conjunctivae normal.  Cardiovascular:     Rate and Rhythm: Normal rate.  Pulmonary:     Effort: Pulmonary effort is normal.  Abdominal:     Palpations: Abdomen is soft.  Musculoskeletal:     Cervical back: Normal range of motion.  Skin:    General: Skin is warm.  Neurological:     Mental Status: She is alert.     UC Treatments / Results  Labs (all labs ordered are listed, but only abnormal results are displayed) Labs Reviewed  SARS CORONAVIRUS 2 (TAT 6-24 HRS)    EKG   Radiology No results found.  Procedures Procedures (including critical care time)  Medications Ordered in UC Medications - No data to display  Initial Impression / Assessment and Plan / UC Course  I have reviewed the triage vital signs and the nursing notes.  Pertinent labs & imaging results that were available during my care of the patient were reviewed by me and considered in my medical decision making (see chart for details).     Your symptoms are from covid Take tylenol and  motrin as needed for  pain and fever  Stay hydrated well  Your symptoms can take several days to resolve Use humidifier as needed  For cough or sob cont use of inhaler  Go to er if symptoms are not better in 48 hours  Final Clinical Impressions(s) / UC Diagnoses   Final diagnoses:  Acute cough  Sore throat  COVID     Discharge Instructions      Start the antiviral that your pcp has called in  Use albuterol inhaler as needed for cough or any shortness of breath  If symptoms become worse go to the er  You will need to quarantine for 5 days of the positive test      ED Prescriptions     Medication Sig Dispense Auth. Provider   albuterol (VENTOLIN HFA) 108 (90 Base) MCG/ACT inhaler Inhale 1-2 puffs into the lungs every 6 (six) hours as needed for wheezing or shortness of breath. 90 g Morley Kos L, NP   predniSONE (STERAPRED UNI-PAK 21 TAB) 10 MG (21) TBPK tablet Take by mouth daily. Take 6 tabs by mouth daily  for 2 days, then 5 tabs for 2 days, then 4 tabs for 2 days, then 3 tabs for 2 days, 2 tabs for 2 days, then 1 tab by mouth daily for 2 days 42 tablet Marney Setting, NP      PDMP not reviewed this encounter.   Marney Setting, NP 07/03/21 1239

## 2021-07-03 NOTE — Discharge Instructions (Addendum)
Start the antiviral that your pcp has called in  Use albuterol inhaler as needed for cough or any shortness of breath  If symptoms become worse go to the er  You will need to quarantine for 5 days of the positive test

## 2021-07-03 NOTE — ED Triage Notes (Signed)
Pt c/o sore throat, cough that started on 11/25. Saw nurse at work on Monday and pt given take home kit for covid that was positive. Pt made her PCP aware and prescribed Lagevrio yesterday but patient wants a second opinion since she diagnosed herself.

## 2021-07-04 LAB — SARS CORONAVIRUS 2 (TAT 6-24 HRS): SARS Coronavirus 2: POSITIVE — AB

## 2021-07-15 ENCOUNTER — Ambulatory Visit: Payer: Medicare Other

## 2021-08-12 ENCOUNTER — Other Ambulatory Visit: Payer: Self-pay

## 2021-08-12 ENCOUNTER — Ambulatory Visit: Payer: Self-pay | Attending: Surgery

## 2021-08-12 VITALS — Wt 153.4 lb

## 2021-08-12 DIAGNOSIS — Z483 Aftercare following surgery for neoplasm: Secondary | ICD-10-CM | POA: Insufficient documentation

## 2021-08-12 NOTE — Therapy (Signed)
[facility] @ [facility] [place], [place], [address] Phone: [phone]   Fax:  [phone]  Physical Therapy Treatment  Patient Details  Name: Victoria Huang MRN: [id number] Date of Birth: [birth year] Referring Provider (PT): Dr. Donnie Mesa   Encounter Date: 08/12/2021   PT End of Session - 08/12/21 0917     Visit Number 15   # unchanged due to screen only   PT Start Time 0915    PT Stop Time 0923    PT Time Calculation (min) 8 min    Activity Tolerance Patient tolerated treatment well    Behavior During Therapy [facility] for tasks assessed/performed             Past Medical History:  Diagnosis Date   Breast cancer ([place])    Family history of breast cancer 01/23/2021   Family history of prostate cancer 01/23/2021   High cholesterol    Hypertension     Past Surgical History:  Procedure Laterality Date   MASTECTOMY W/ SENTINEL NODE BIOPSY Right 02/13/2021   Procedure: RIGHT MASTECTOMY WITH SENTINEL LYMPH NODE BIOPSY;  Surgeon: Donnie Mesa, MD;  Location: [place];  Service: General;  Laterality: Right;    Vitals:   08/12/21 0917  Weight: 153 lb 6 oz (69.6 kg)     Subjective Assessment - 08/12/21 0916     Subjective Pt returns for her 3 month SOZO.    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 She had a right mastectomy and sentinel node biopsy (1 negative node) on 02/13/2021.                    L-DEX FLOWSHEETS - 08/12/21 0900       L-DEX LYMPHEDEMA SCREENING   Measurement Type Unilateral    L-DEX MEASUREMENT EXTREMITY Upper Extremity    POSITION  Standing    DOMINANT SIDE Left    At Risk Side Left    BASELINE SCORE (UNILATERAL) -2    L-DEX SCORE (UNILATERAL) 3.6    VALUE CHANGE (UNILAT) 5.6                                     PT Long Term Goals - 05/21/21 1334        PT LONG TERM GOAL #1   Title Patient will demonstrate she has regained full shoulder ROM and function post operatively compared to baselines.    Period Weeks    Status Achieved      PT LONG TERM GOAL #2   Title Patient will report pain decreased to </= 4/10 with most daily tasks.    Baseline no pain with ADL's    Time 4    Period Weeks    Status Achieved      PT LONG TERM GOAL #3   Title Patient will increase right shoulder active flexion to >/=120 degrees for increased ease reaching overhead.    Time 4    Period Weeks    Status Achieved      PT LONG TERM GOAL #4   Title Patient will increase right shoulder active abduction to >/= 120 degrees for increased ease reaching, dressing, and performing kitchen tasks.    Time 4    Period Weeks    Status Achieved      PT LONG  TERM GOAL #5   Title Patient will verbalize good understanding of lymphedema risk reduction practices.    Time 4    Status On-going      PT LONG TERM GOAL #6   Title Pt will have decreased swelling/soreness at lateral chest    Time 4    Period Weeks    Status Achieved      PT LONG TERM GOAL #7   Title pt will be able to return demonstrate self MLD    Time 4    Status Achieved      PT LONG TERM GOAL #8   Title Pt will have decreased tightness/discomfort from cording by 75% or greater    Time 4    Period Weeks    Status Achieved                   Plan - 08/12/21 2836     Clinical Impression Statement Pt returns for her 3 month L-Dex screen. Her change from baseline of 5.6 is WNLs so no further treatment is required at this time except to cont every 3 month L-dex screens which pt is agreeable to.    PT Next Visit Plan Cont every 3 month L-Dex screens for up to 2 years from her SLNB (~02/14/2023)    Consulted and Agree with Plan of Care Patient             Patient will benefit from skilled therapeutic intervention in order to improve the following deficits and impairments:     Visit  Diagnosis: Aftercare following surgery for neoplasm     Problem List Patient Active Problem List   Diagnosis Date Noted   Breast neoplasm, Tis (DCIS), right 02/13/2021   Genetic testing 01/31/2021   Family history of breast cancer 01/23/2021   Family history of prostate cancer 01/23/2021   Ductal carcinoma in situ (DCIS) of right breast 01/17/2021    Otelia Limes, PTA 08/12/2021, 9:26 AM  [facility] @ [place], [place], [address] Phone: [phone]   Fax:  [phone]  Name: Victoria Huang MRN: [id number] Date of Birth: 1946-03-07

## 2021-11-11 ENCOUNTER — Ambulatory Visit: Payer: Medicare PPO | Attending: Surgery

## 2021-11-11 VITALS — Wt 150.1 lb

## 2021-11-11 DIAGNOSIS — Z483 Aftercare following surgery for neoplasm: Secondary | ICD-10-CM | POA: Insufficient documentation

## 2021-11-11 NOTE — Therapy (Addendum)
?  OUTPATIENT PHYSICAL THERAPY SOZO SCREENING NOTE ? ? ?Patient Name: Victoria Huang ?MRN: [id number] ?DOB:10/27/1945, 76 y.o., female ?Today's Date: 11/11/2021 ? ?PCP: Velna Hatchet, MD ?REFERRING PROVIDER: Donnie Mesa, MD ? ? PT End of Session - 11/11/21 1020   ? ? Visit Number 15   # unchanged due to screen only  ? PT Start Time 1018   ? PT Stop Time 7218   ? PT Time Calculation (min) 9 min   ? Activity Tolerance Patient tolerated treatment well   ? Behavior During Therapy [facility] for tasks assessed/performed   ? ?  ?  ? ?  ? ? ?Past Medical History:  ?Diagnosis Date  ? Breast cancer ([place])   ? Family history of breast cancer 01/23/2021  ? Family history of prostate cancer 01/23/2021  ? High cholesterol   ? Hypertension   ? ?Past Surgical History:  ?Procedure Laterality Date  ? MASTECTOMY W/ SENTINEL NODE BIOPSY Right 02/13/2021  ? Procedure: RIGHT MASTECTOMY WITH SENTINEL LYMPH NODE BIOPSY;  Surgeon: Donnie Mesa, MD;  Location: [facility];  Service: General;  Laterality: Right;  ? ?Patient Active Problem List  ? Diagnosis Date Noted  ? Breast neoplasm, Tis (DCIS), right 02/13/2021  ? Genetic testing 01/31/2021  ? Family history of breast cancer 01/23/2021  ? Family history of prostate cancer 01/23/2021  ? Ductal carcinoma in situ (DCIS) of right breast 01/17/2021  ? ? ?REFERRING DIAG: right breast cancer at risk for lymphedema ? ?THERAPY DIAG:  ?Aftercare following surgery for neoplasm ? ?PERTINENT HISTORY: Patient was diagnosed on 12/06/2020 with right high grade DCIS breast cancer. It is ER/PR positive. She had a right mastectomy and sentinel node biopsy (1 negative node) on 02/13/2021.  ? ?PRECAUTIONS: right UE Lymphedema risk, None ? ?SUBJECTIVE: Pt returns for her 3 month L-Dex screen.  ? ?PAIN:  ?Are you having pain? No ? ?SOZO SCREENING: ?Patient was assessed today using the SOZO machine to determine the lymphedema index score. This was compared to her baseline score. It was determined that  she is within the recommended range when compared to her baseline and no further action is needed at this time. She will continue SOZO screenings. These are done every 3 months for 2 years post operatively followed by every 6 months for 2 years, and then annually. ? ? ? ?Otelia Limes, PTA ?11/11/2021, 10:27 AM ? ?  ? ?

## 2021-11-26 ENCOUNTER — Other Ambulatory Visit: Payer: Self-pay | Admitting: *Deleted

## 2021-11-26 MED ORDER — TAMOXIFEN CITRATE 20 MG PO TABS: 20.0000 mg | ORAL_TABLET | Freq: Every day | ORAL | 3 refills | Status: DC

## 2021-12-10 ENCOUNTER — Ambulatory Visit
Admission: RE | Admit: 2021-12-10 | Discharge: 2021-12-10 | Disposition: A | Discharge status: Home / Self Care | Payer: Medicare PPO | Source: Ambulatory Visit | Attending: Adult Health | Admitting: Adult Health

## 2021-12-10 DIAGNOSIS — Z1231 Encounter for screening mammogram for malignant neoplasm of breast: Secondary | ICD-10-CM

## 2021-12-10 DIAGNOSIS — D0511 Intraductal carcinoma in situ of right breast: Secondary | ICD-10-CM

## 2021-12-11 ENCOUNTER — Other Ambulatory Visit: Payer: Self-pay | Admitting: Adult Health

## 2021-12-11 DIAGNOSIS — N644 Mastodynia: Secondary | ICD-10-CM

## 2022-01-02 ENCOUNTER — Other Ambulatory Visit: Payer: Medicare PPO

## 2022-01-20 ENCOUNTER — Ambulatory Visit
Admission: RE | Admit: 2022-01-20 | Discharge: 2022-01-20 | Disposition: A | Discharge status: Home / Self Care | Payer: Medicare PPO | Source: Ambulatory Visit | Attending: Adult Health | Admitting: Adult Health

## 2022-01-20 ENCOUNTER — Other Ambulatory Visit: Payer: Self-pay | Admitting: Adult Health

## 2022-01-20 DIAGNOSIS — N644 Mastodynia: Secondary | ICD-10-CM

## 2022-01-29 ENCOUNTER — Other Ambulatory Visit: Payer: Medicare PPO

## 2022-02-12 ENCOUNTER — Ambulatory Visit: Payer: Medicare Other | Admitting: Hematology and Oncology

## 2022-03-31 ENCOUNTER — Ambulatory Visit: Payer: Medicare PPO | Attending: Surgery

## 2022-03-31 DIAGNOSIS — Z483 Aftercare following surgery for neoplasm: Secondary | ICD-10-CM | POA: Insufficient documentation

## 2022-05-06 DIAGNOSIS — Z23 Encounter for immunization: Secondary | ICD-10-CM | POA: Diagnosis not present

## 2022-05-06 DIAGNOSIS — E785 Hyperlipidemia, unspecified: Secondary | ICD-10-CM | POA: Diagnosis not present

## 2022-05-06 DIAGNOSIS — I10 Essential (primary) hypertension: Secondary | ICD-10-CM | POA: Diagnosis not present

## 2022-05-06 DIAGNOSIS — N814 Uterovaginal prolapse, unspecified: Secondary | ICD-10-CM | POA: Diagnosis not present

## 2022-05-06 DIAGNOSIS — R7303 Prediabetes: Secondary | ICD-10-CM | POA: Diagnosis not present

## 2022-05-06 DIAGNOSIS — D0511 Intraductal carcinoma in situ of right breast: Secondary | ICD-10-CM | POA: Diagnosis not present

## 2022-05-06 DIAGNOSIS — H539 Unspecified visual disturbance: Secondary | ICD-10-CM | POA: Diagnosis not present

## 2022-09-14 ENCOUNTER — Other Ambulatory Visit: Payer: Self-pay | Admitting: Hematology and Oncology

## 2022-09-14 DIAGNOSIS — D0511 Intraductal carcinoma in situ of right breast: Secondary | ICD-10-CM

## 2022-09-15 ENCOUNTER — Telehealth: Payer: Self-pay | Admitting: *Deleted

## 2022-09-15 NOTE — Telephone Encounter (Signed)
Received refill request for Tamoxifen. Last prescribed 11/26/2021.   Ms. Weerts was to see Dr. Lindi Adie in July 2023, but had to cancel due to illness. She last saw Dr. Lindi Adie in August 2022 and had Survivorship appt with Mendel Ryder in November 2022.  Schedule message sent to  schedule asap with Dr. Lindi Adie for f/u appt.   Refill sent for Tamoxifen for 90 days per Dr. Geralyn Flash OV note 03/19/21:  "Adjuvant Anti estrogen therapy with Tamoxifen for 5 years"   Contacted patient and left VM with following information: Refill request received for Tamoxifen. Per Dr. Geralyn Flash plan/OV note, patient to continue Tamoxifen. Message sent to scheduling to contact patient to make appt since appt in July 2023 was missed due to illness.

## 2022-09-15 NOTE — Telephone Encounter (Signed)
See note in chart

## 2022-09-16 ENCOUNTER — Telehealth: Payer: Self-pay | Admitting: Hematology and Oncology

## 2022-09-16 NOTE — Telephone Encounter (Signed)
Scheduled appointment per staff message. Left voicemail.

## 2022-09-25 NOTE — Progress Notes (Incomplete)
Patient Care Team: Velna Hatchet, MD as PCP - General (Internal Medicine) Donnie Mesa, MD as Consulting Physician (General Surgery) Nicholas Lose, MD as Consulting Physician (Hematology and Oncology) Kyung Rudd, MD as Consulting Physician (Radiation Oncology)  DIAGNOSIS: No diagnosis found.  SUMMARY OF ONCOLOGIC HISTORY: Oncology History  Ductal carcinoma in situ (DCIS) of right breast  01/17/2021 Initial Diagnosis   Screening mammogram detected right breast asymmetry and calcifications.  2 areas were detected 4 o'clock position 2.5 cm calcifications, LIQ: 9.1 cm, classifications.  Ultrasound-guided biopsy revealed high-grade DCIS with calcifications and necrosis ER 100%, PR 100%   02/04/2021 Genetic Testing   Negative hereditary cancer genetic testing: no pathogenic variants detected in [facility] CancerNext-Expanded +RNAinsight Panel.  The report date is February 04, 2021.    The CustomNext-Cancer+RNAinsight panel offered by [facility] includes sequencing and rearrangement analysis for the following 47 genes:  APC, ATM, AXIN2, BARD1, BMPR1A, BRCA1, BRCA2, BRIP1, CDH1, CDK4, CDKN2A, CHEK2, DICER1, EPCAM, GREM1, HOXB13, MEN1, MLH1, MSH2, MSH3, MSH6, MUTYH, NBN, NF1, NF2, NTHL1, PALB2, PMS2, POLD1, POLE, PTEN, RAD51C, RAD51D, RECQL, RET, SDHA, SDHAF2, SDHB, SDHC, SDHD, SMAD4, SMARCA4, STK11, TP53, TSC1, TSC2, and VHL.  RNA data is routinely analyzed for use in variant interpretation for all genes.    02/13/2021 Surgery   Mastectomy of the right breast with Dr. Georgette Dover   Pathology: 1.8 cm intermediate to high grade DCIS with necrosis and calcifications, resection margins negative for DCIS, and axillary lymph node negative for carcinoma   02/13/2021 Cancer Staging   Staging form: Breast, AJCC 8th Edition - Pathologic stage from 02/13/2021: Stage 0 (pTis (DCIS), pN0, cM0) - Signed by Gardenia Phlegm, NP on 06/19/2021   03/2021 -  Anti-estrogen oral therapy   Tamoxifen daily      CHIEF COMPLIANT: Follow-up of right breast cancer   INTERVAL HISTORY: Victoria Huang is a 77 y.o. with above-mentioned history of DCIS of the right breast cancer. She presents to the clinic for a follow-up.    ALLERGIES:  is allergic to other.  MEDICATIONS:  Current Outpatient Medications  Medication Sig Dispense Refill   albuterol (VENTOLIN HFA) 108 (90 Base) MCG/ACT inhaler Inhale 1-2 puffs into the lungs every 6 (six) hours as needed for wheezing or shortness of breath. 90 g 0   amLODipine (NORVASC) 5 MG tablet Take 1 tablet by mouth at bedtime.     oxyCODONE (OXY IR/ROXICODONE) 5 MG immediate release tablet Take 1 tablet (5 mg total) by mouth every 6 (six) hours as needed for severe pain. (Patient not taking: Reported on 06/19/2021) 20 tablet 0   predniSONE (STERAPRED UNI-PAK 21 TAB) 10 MG (21) TBPK tablet Take by mouth daily. Take 6 tabs by mouth daily  for 2 days, then 5 tabs for 2 days, then 4 tabs for 2 days, then 3 tabs for 2 days, 2 tabs for 2 days, then 1 tab by mouth daily for 2 days 42 tablet 0   rosuvastatin (CRESTOR) 20 MG tablet Take 20 mg by mouth at bedtime.     tamoxifen (NOLVADEX) 20 MG tablet TAKE 1 TABLET EVERY DAY 90 tablet 0   No current facility-administered medications for this visit.    PHYSICAL EXAMINATION: ECOG PERFORMANCE STATUS: {CHL ONC ECOG PS:[phone]}  There were no vitals filed for this visit. There were no vitals filed for this visit.  BREAST:*** No palpable masses or nodules in either right or left breasts. No palpable axillary supraclavicular or infraclavicular adenopathy no breast tenderness or nipple discharge. (  exam performed in the presence of a chaperone)  LABORATORY DATA:  I have reviewed the data as listed    Latest Ref Rng & Units 01/23/2021    8:34 AM  CMP  Glucose 70 - 99 mg/dL 138   BUN 8 - 23 mg/dL 12   Creatinine 0.44 - 1.00 mg/dL 0.72   Sodium 135 - 145 mmol/L 140   Potassium 3.5 - 5.1 mmol/L 3.9   Chloride 98 -  111 mmol/L 109   CO2 22 - 32 mmol/L 22   Calcium 8.9 - 10.3 mg/dL 9.5   Total Protein 6.5 - 8.1 g/dL 7.1   Total Bilirubin 0.3 - 1.2 mg/dL 0.4   Alkaline Phos 38 - 126 U/L 79   AST 15 - 41 U/L 17   ALT 0 - 44 U/L 12     Lab Results  Component Value Date   WBC 5.7 01/23/2021   HGB 12.1 01/23/2021   HCT 37.5 01/23/2021   MCV 80.8 01/23/2021   PLT 277 01/23/2021   NEUTROABS 3.2 01/23/2021    ASSESSMENT & PLAN:  No problem-specific Assessment & Plan notes found for this encounter.    No orders of the defined types were placed in this encounter.  The patient has a good understanding of the overall plan. she agrees with it. she will call with any problems that may develop before the next visit here. Total time spent: 30 mins including face to face time and time spent for planning, charting and co-ordination of care   Suzzette Righter, [place] 09/25/22    I Gardiner Coins am acting as a Education administrator for [employer]  ***

## 2022-09-30 ENCOUNTER — Inpatient Hospital Stay: Payer: Medicare PPO | Attending: Hematology and Oncology | Admitting: Hematology and Oncology

## 2022-09-30 NOTE — Assessment & Plan Note (Deleted)
02/13/21: Rt Mastectomy: HG DCIS 1.8 cm, Margins Neg, 0/1 SLN Neg, ER 100%, PR 100% TisN0 Stage 0  Plan: Adjuvant Anti estrogen therapy with Tamoxifen for 5 years   Tamoxifen toxicities:  Breast cancer surveillance: Breast exam 09/30/2022: Benign Mammogram and ultrasound left breast 01/20/2022: Benign density category C  Return to clinic in 1 year for follow-up

## 2022-11-12 DIAGNOSIS — R7309 Other abnormal glucose: Secondary | ICD-10-CM | POA: Diagnosis not present

## 2022-11-12 DIAGNOSIS — E785 Hyperlipidemia, unspecified: Secondary | ICD-10-CM | POA: Diagnosis not present

## 2022-11-12 DIAGNOSIS — I10 Essential (primary) hypertension: Secondary | ICD-10-CM | POA: Diagnosis not present

## 2022-11-12 DIAGNOSIS — R7989 Other specified abnormal findings of blood chemistry: Secondary | ICD-10-CM | POA: Diagnosis not present

## 2022-11-18 DIAGNOSIS — Z1331 Encounter for screening for depression: Secondary | ICD-10-CM | POA: Diagnosis not present

## 2022-11-18 DIAGNOSIS — N814 Uterovaginal prolapse, unspecified: Secondary | ICD-10-CM | POA: Diagnosis not present

## 2022-11-18 DIAGNOSIS — Z0000 Encounter for general adult medical examination without abnormal findings: Secondary | ICD-10-CM | POA: Diagnosis not present

## 2022-11-18 DIAGNOSIS — I10 Essential (primary) hypertension: Secondary | ICD-10-CM | POA: Diagnosis not present

## 2022-11-18 DIAGNOSIS — R7303 Prediabetes: Secondary | ICD-10-CM | POA: Diagnosis not present

## 2022-11-18 DIAGNOSIS — E785 Hyperlipidemia, unspecified: Secondary | ICD-10-CM | POA: Diagnosis not present

## 2022-11-18 DIAGNOSIS — J029 Acute pharyngitis, unspecified: Secondary | ICD-10-CM | POA: Diagnosis not present

## 2022-11-18 DIAGNOSIS — R82998 Other abnormal findings in urine: Secondary | ICD-10-CM | POA: Diagnosis not present

## 2022-11-18 DIAGNOSIS — Z1339 Encounter for screening examination for other mental health and behavioral disorders: Secondary | ICD-10-CM | POA: Diagnosis not present

## 2022-11-21 DIAGNOSIS — Z1212 Encounter for screening for malignant neoplasm of rectum: Secondary | ICD-10-CM | POA: Diagnosis not present

## 2022-11-27 ENCOUNTER — Other Ambulatory Visit: Payer: Self-pay | Admitting: Hematology and Oncology

## 2022-11-27 DIAGNOSIS — D0511 Intraductal carcinoma in situ of right breast: Secondary | ICD-10-CM

## 2022-12-15 ENCOUNTER — Telehealth: Payer: Self-pay | Admitting: Hematology and Oncology

## 2022-12-15 NOTE — Telephone Encounter (Signed)
Scheduled appointment per 5/10 los. Patient is aware of the made appointment. 

## 2022-12-18 ENCOUNTER — Other Ambulatory Visit: Payer: Self-pay | Admitting: Hematology and Oncology

## 2022-12-18 DIAGNOSIS — D0511 Intraductal carcinoma in situ of right breast: Secondary | ICD-10-CM

## 2023-01-07 NOTE — Progress Notes (Signed)
HEMATOLOGY-ONCOLOGY TELEPHONE VISIT PROGRESS NOTE  I connected with our patient on 01/21/23 at 11:45 AM EDT by telephone and verified that I am speaking with the correct person using two identifiers.  I discussed the limitations, risks, security and privacy concerns of performing an evaluation and management service by telephone and the availability of in person appointments.  I also discussed with the patient that there may be a patient responsible charge related to this service. The patient expressed understanding and agreed to proceed.   History of Present Illness: Victoria Huang is a 77 y.o. with above-mentioned history of DCIS of the right breast cancer. She presents to the clinic for a telephone follow-up.  She is tolerating tamoxifen extremely well without any problems or concerns.   Oncology History  Ductal carcinoma in situ (DCIS) of right breast  01/17/2021 Initial Diagnosis   Screening mammogram detected right breast asymmetry and calcifications.  2 areas were detected 4 o'clock position 2.5 cm calcifications, LIQ: 9.1 cm, classifications.  Ultrasound-guided biopsy revealed high-grade DCIS with calcifications and necrosis ER 100%, PR 100%   02/04/2021 Genetic Testing   Negative hereditary cancer genetic testing: no pathogenic variants detected in [facility] CancerNext-Expanded +RNAinsight Panel.  The report date is February 04, 2021.    The CustomNext-Cancer+RNAinsight panel offered by [facility] includes sequencing and rearrangement analysis for the following 47 genes:  APC, ATM, AXIN2, BARD1, BMPR1A, BRCA1, BRCA2, BRIP1, CDH1, CDK4, CDKN2A, CHEK2, DICER1, EPCAM, GREM1, HOXB13, MEN1, MLH1, MSH2, MSH3, MSH6, MUTYH, NBN, NF1, NF2, NTHL1, PALB2, PMS2, POLD1, POLE, PTEN, RAD51C, RAD51D, RECQL, RET, SDHA, SDHAF2, SDHB, SDHC, SDHD, SMAD4, SMARCA4, STK11, TP53, TSC1, TSC2, and VHL.  RNA data is routinely analyzed for use in variant interpretation for all genes.    02/13/2021 Surgery   Mastectomy  of the right breast with Dr. Corliss Skains   Pathology: 1.8 cm intermediate to high grade DCIS with necrosis and calcifications, resection margins negative for DCIS, and axillary lymph node negative for carcinoma   02/13/2021 Cancer Staging   Staging form: Breast, AJCC 8th Edition - Pathologic stage from 02/13/2021: Stage 0 (pTis (DCIS), pN0, cM0) - Signed by Loa Socks, NP on 06/19/2021   03/2021 -  Anti-estrogen oral therapy   Tamoxifen daily     REVIEW OF SYSTEMS:   Constitutional: Denies fevers, chills or abnormal weight loss All other systems were reviewed with the patient and are negative. Observations/Objective:     Assessment Plan:  Ductal carcinoma in situ (DCIS) of right breast 02/13/21: Rt Mastectomy: HG DCIS 1.8 cm, Margins Neg, 0/1 SLN Neg, ER 100%, PR 100% TisN0 Stage 0 Current treatment: Tamoxifen started 03/19/2021 Tamoxifen toxicities: Denies any side effects to tamoxifen.  Breast cancer surveillance: Breast exam 01/21/2023: Benign Mammogram left breast 01/20/2022: Benign breast density category C.  Patient will need a new mammogram.  Return to clinic in 1 year for follow-up with a telephone visit  I discussed the assessment and treatment plan with the patient. The patient was provided an opportunity to ask questions and all were answered. The patient agreed with the plan and demonstrated an understanding of the instructions. The patient was advised to call back or seek an in-person evaluation if the symptoms worsen or if the condition fails to improve as anticipated.   I provided 12 minutes of non-face-to-face time during this encounter.  This includes time for charting and coordination of care   Victoria Meek, MD  I Janan Ridge am acting as a Neurosurgeon for Dr.Aaronmichael [facility]  I have reviewed the above documentation for accuracy and completeness, and I agree with the above.

## 2023-01-10 ENCOUNTER — Other Ambulatory Visit: Payer: Self-pay | Admitting: Hematology and Oncology

## 2023-01-10 DIAGNOSIS — D0511 Intraductal carcinoma in situ of right breast: Secondary | ICD-10-CM

## 2023-01-21 ENCOUNTER — Other Ambulatory Visit: Payer: Self-pay | Admitting: Internal Medicine

## 2023-01-21 ENCOUNTER — Inpatient Hospital Stay: Payer: Medicare PPO | Attending: Hematology and Oncology | Admitting: Hematology and Oncology

## 2023-01-21 DIAGNOSIS — Z853 Personal history of malignant neoplasm of breast: Secondary | ICD-10-CM

## 2023-01-21 DIAGNOSIS — D0511 Intraductal carcinoma in situ of right breast: Secondary | ICD-10-CM | POA: Diagnosis not present

## 2023-01-21 MED ORDER — TAMOXIFEN CITRATE 20 MG PO TABS: 20.0000 mg | ORAL_TABLET | Freq: Every day | ORAL | 3 refills | Status: AC

## 2023-01-21 NOTE — Assessment & Plan Note (Addendum)
02/13/21: Rt Mastectomy: HG DCIS 1.8 cm, Margins Neg, 0/1 SLN Neg, ER 100%, PR 100% TisN0 Stage 0 Current treatment: Tamoxifen started 03/19/2021 Tamoxifen toxicities:  Breast cancer surveillance: Breast exam 01/21/2023: Benign Mammogram left breast 01/20/2022: Benign breast density category C.  Patient will need a new mammogram.  Return to clinic in 1 year for follow-up

## 2023-02-01 IMAGING — US US BREAST*R* LIMITED INC AXILLA
1 series · 12 of 25 positions shown · non-contrast
Comparison: 12/06/2020

CLINICAL DATA: Patient returns after new baseline study for
evaluation of possible RIGHT breast asymmetry and RIGHT breast
calcifications.

EXAM:
DIGITAL DIAGNOSTIC UNILATERAL RIGHT MAMMOGRAM WITH TOMOSYNTHESIS AND
CAD; ULTRASOUND RIGHT BREAST LIMITED
TECHNIQUE: Right digital diagnostic mammography and breast tomosynthesis was
performed. The images were evaluated with computer-aided detection.;
Targeted ultrasound examination of the right breast was performed

[Series 1: us breast*right* limited inc axilla · 0.06mm/px · 30 acquisitions, 12 frames shown]
[im 2/30]
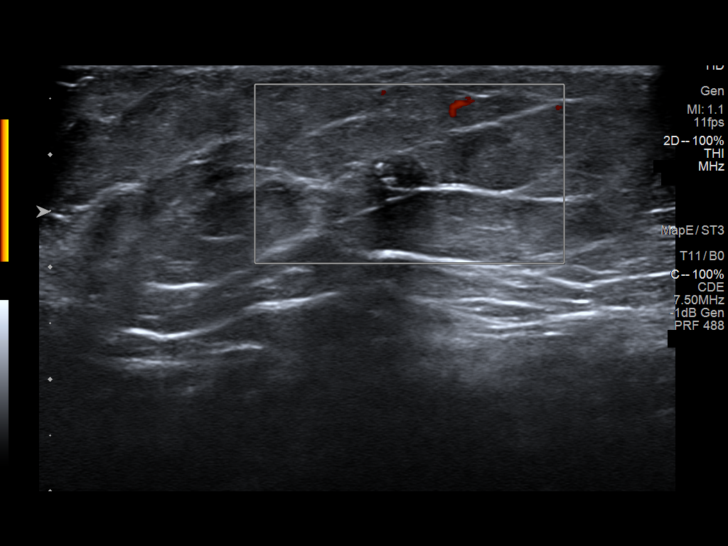
[im 4/30]
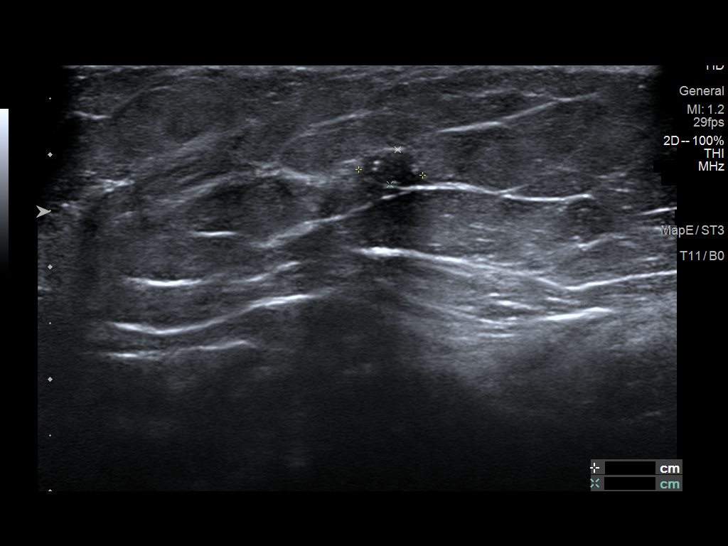
[im 7/30]
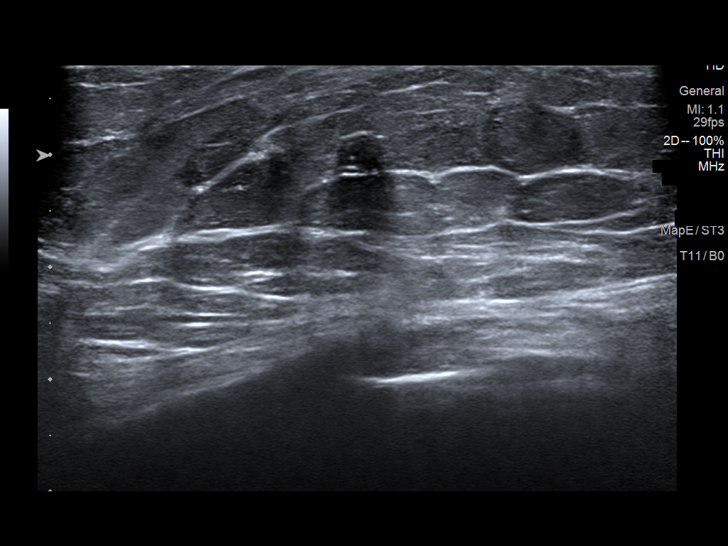
[im 9/30]
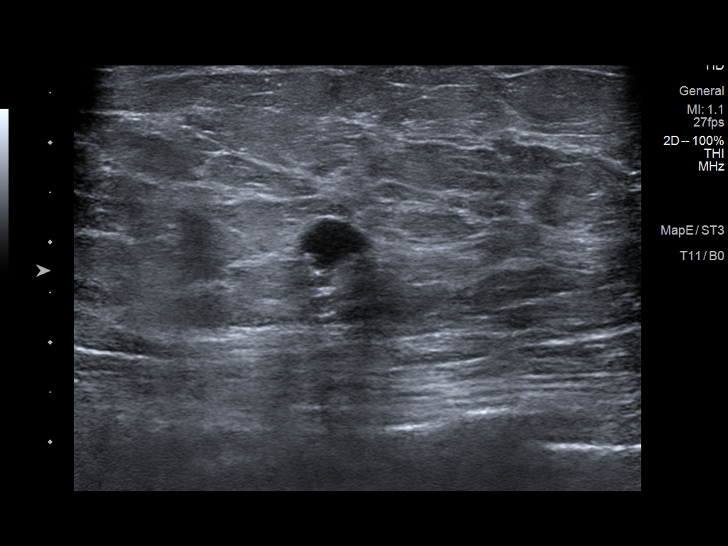
[im 11/30]
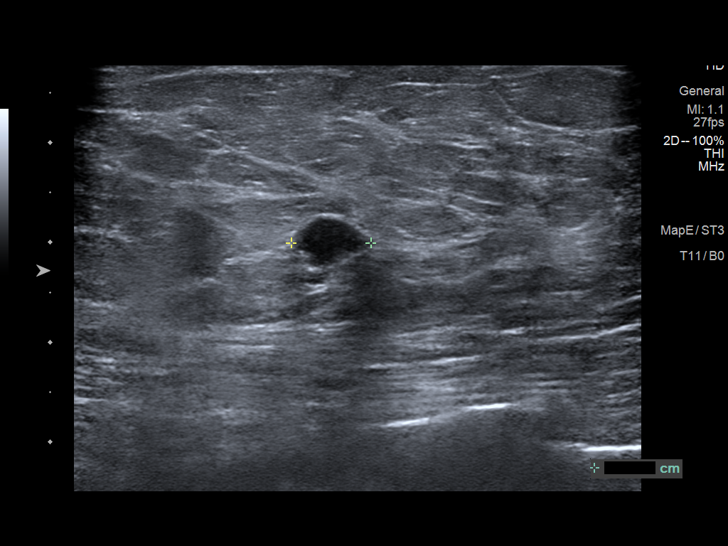
[im 14/30]
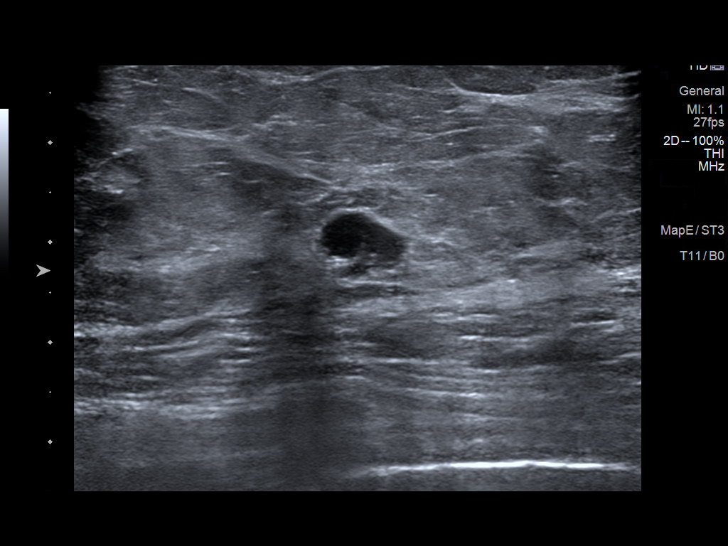
[im 16/30]
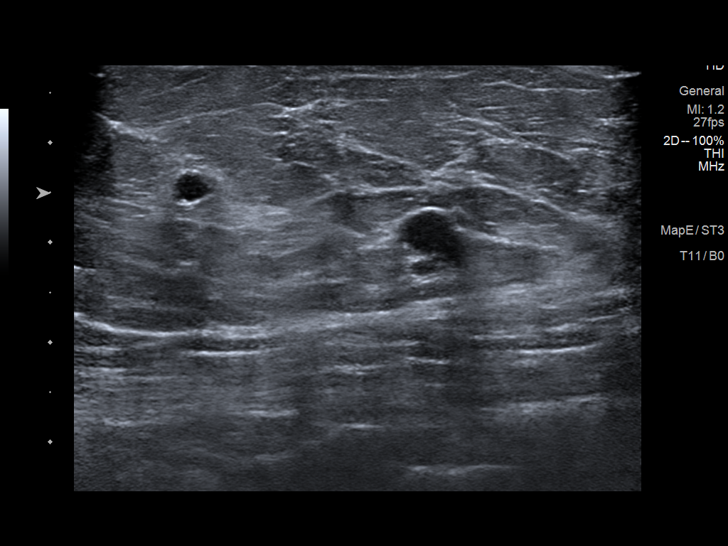
[im 19/30]
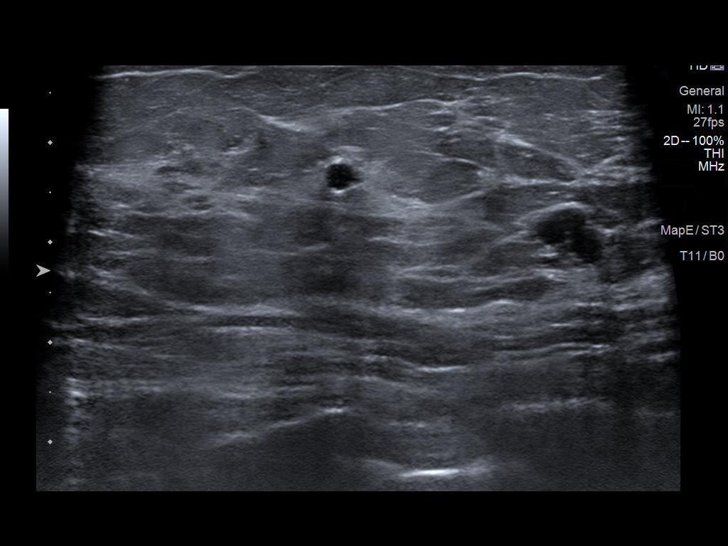
[im 21/30]
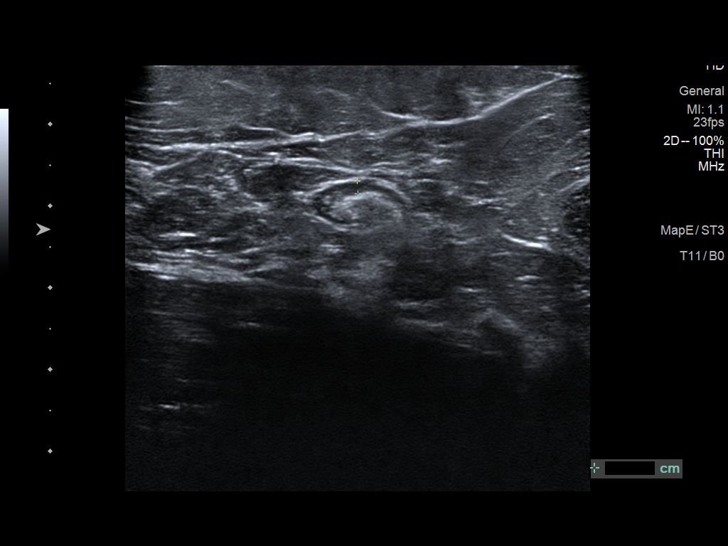
[im 23/30]
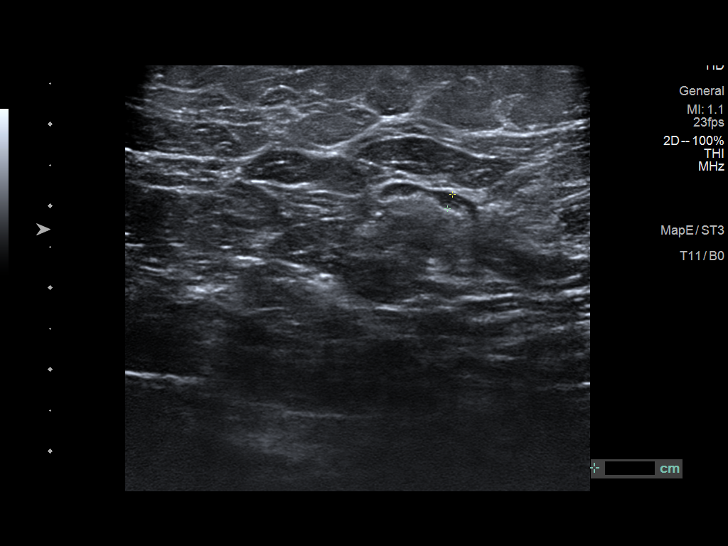
[im 26/30]
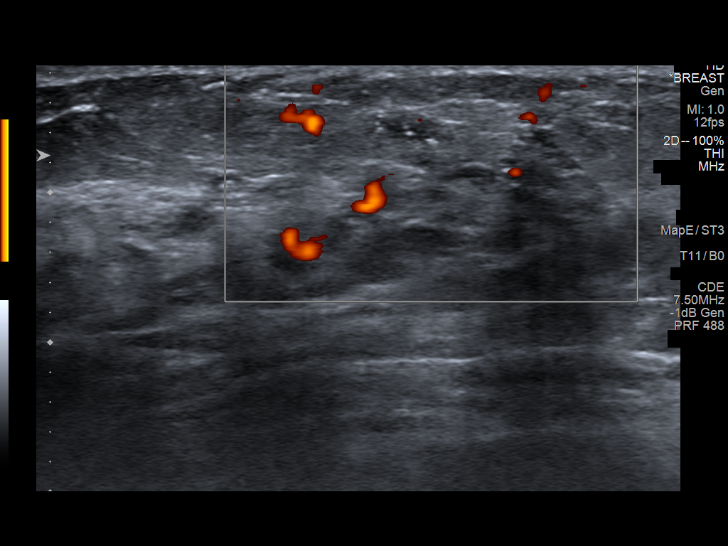
[im 28/30]
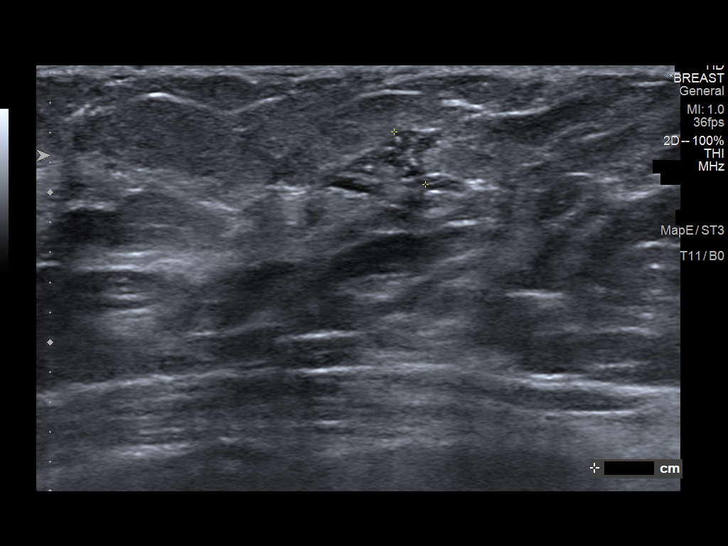

[12 of 25 positions shown; findings below may reference images not displayed]

ACR Breast Density Category b: There are scattered areas of
fibroglandular density.
FINDINGS: Spot compression views are performed of the LATERAL portion of the
RIGHT breast, confirming presence of asymmetry/possible mass in the
UPPER-OUTER QUADRANT.

Magnification views are performed of calcifications in the LOWER
INNER QUADRANT of the RIGHT breast. These views demonstrate
extensive faint linear calcifications spanning 9.1 x 2.9 x
centimeters. Along the most MEDIAL aspect of the calcifications,
there is associated parenchymal density.

A small mass associated with calcifications is identified in the
LOWER portion of the RIGHT breast.

On physical exam, I palpate no abnormality in the MEDIAL aspect of
the RIGHT breast.

Targeted ultrasound is performed, showing numerous hypoechoic
parallel oval masses in the UPPER-OUTER QUADRANT of the RIGHT
breast, largest measuring 0.9 centimeters. No solid mass or areas of
acoustic shadowing identified in the LATERAL aspect of the RIGHT
breast.

In the 4 o'clock location of the RIGHT breast 8 centimeters from the
nipple, there is an oval circumscribed hypoechoic mass containing
calcifications and associated posterior acoustic shadowing. This
mass measures 0.5 x 0.3 x 0.6 centimeters.

In the 4 o'clock location of the RIGHT breast 10 centimeters from
the nipple, there is tubular shaped hypoechoic tissue containing
calcifications and spanning 2.5 x 0.8 x 0.4 centimeters. These
calcifications are best appreciated at real-time imaging.

Evaluation of the RIGHT axilla is negative for adenopathy.
IMPRESSION: 1. Suspicious calcifications and associated parenchymal density in
the MEDIAL aspect of the RIGHT breast warranting tissue diagnosis.
2. No suspicious abnormality in the UPPER-OUTER QUADRANT of the
RIGHT breast.
3. Indeterminate hypoechoic mass with internal calcifications in the
4 o'clock location of the RIGHT breast 8 centimeters from the
nipple.

RECOMMENDATION:
1. Recommend ultrasound-guided core biopsy of the more MEDIAL
component of the calcifications in the 4 o'clock location 10
centimeters from the nipple.
2. Recommend stereotactic guided core biopsy of the more anterior
calcifications in the MEDIAL aspect of the RIGHT breast.
3. Pending review of pathology results and clip placement, biopsy of
the mass the 4 o'clock location 8 centimeters from the nipple may
not be necessary. As needed, biopsy of this lesion can be performed.

I have discussed the findings and recommendations with the patient.
If applicable, a reminder letter will be sent to the patient
regarding the next appointment.

BI-RADS CATEGORY  5: Highly suggestive of malignancy.

## 2023-02-02 ENCOUNTER — Other Ambulatory Visit: Payer: Self-pay | Admitting: Hematology and Oncology

## 2023-02-02 DIAGNOSIS — D0511 Intraductal carcinoma in situ of right breast: Secondary | ICD-10-CM

## 2023-02-10 ENCOUNTER — Ambulatory Visit
Admission: RE | Admit: 2023-02-10 | Discharge: 2023-02-10 | Disposition: A | Discharge status: Home / Self Care | Payer: Medicare PPO | Source: Ambulatory Visit | Attending: Internal Medicine | Admitting: Internal Medicine

## 2023-02-10 DIAGNOSIS — Z1231 Encounter for screening mammogram for malignant neoplasm of breast: Secondary | ICD-10-CM | POA: Diagnosis not present

## 2023-02-10 DIAGNOSIS — Z853 Personal history of malignant neoplasm of breast: Secondary | ICD-10-CM

## 2023-02-21 IMAGING — US US  BREAST BX W/ LOC DEV 1ST LESION IMG BX SPEC US GUIDE*R*
1 series · 11 of 11 positions shown · non-contrast
Comparison: Previous exam(s).
COMPARISON: Previous exam(s).

Addendum:
CLINICAL DATA: 75-year-old female presenting for ultrasound-guided
biopsy of a mass with calcifications in the medial right breast

EXAM:
ULTRASOUND GUIDED RIGHT BREAST CORE NEEDLE BIOPSY

[Series 1: us breast bx w/ loc dev 1st lesion img bx spec us  · 0.06mm/px · 11 of 11 slices shown]
[im 1/11]
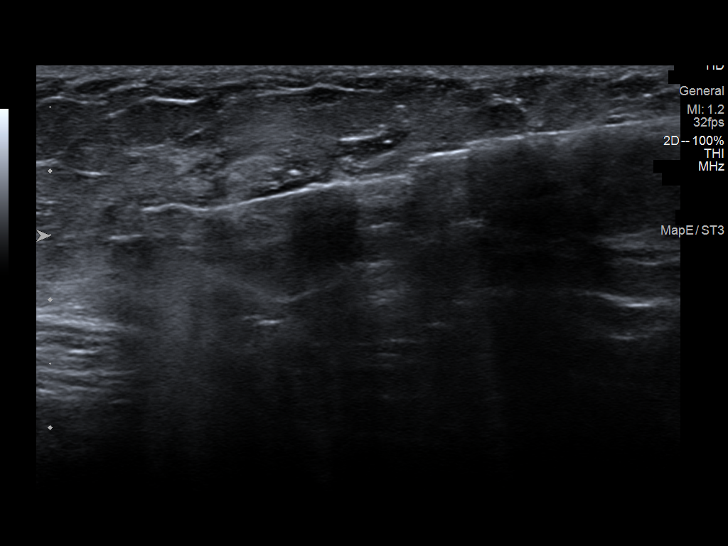
[im 2/11]
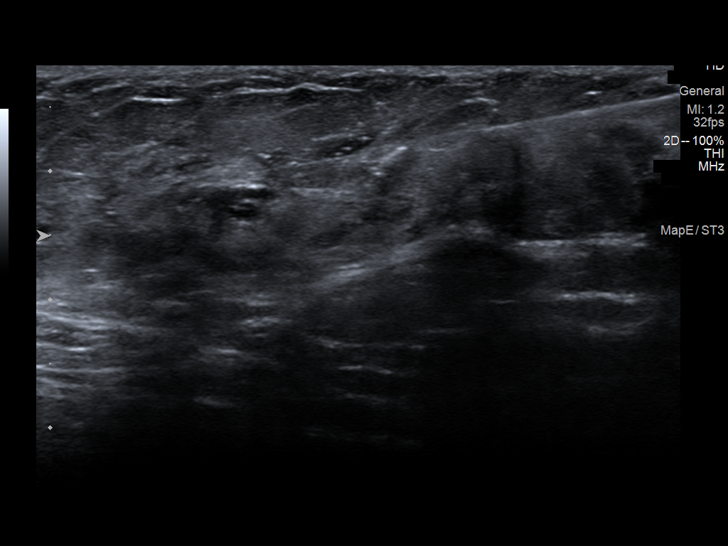
[im 3/11]
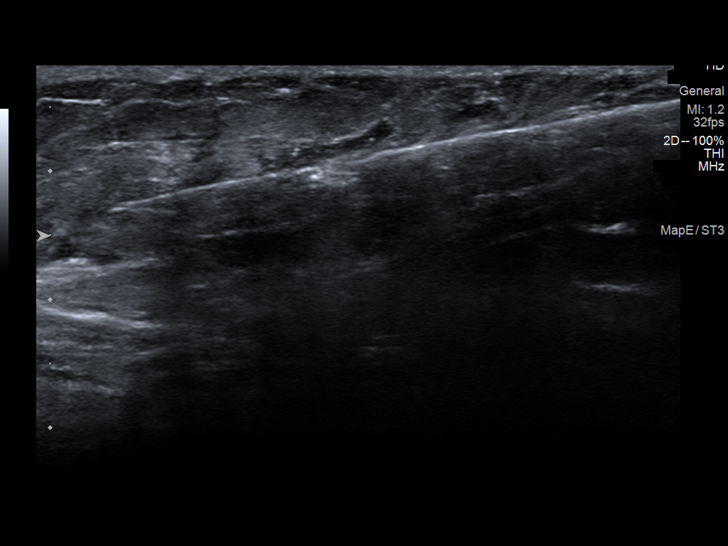
[im 4/11]
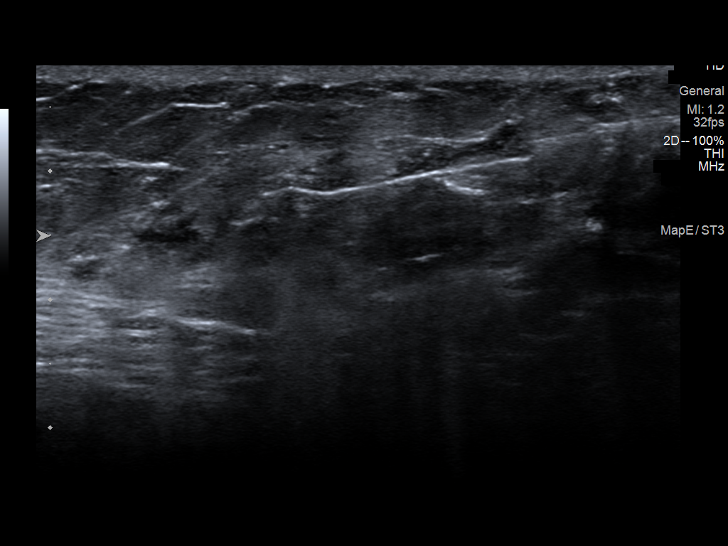
[im 5/11]
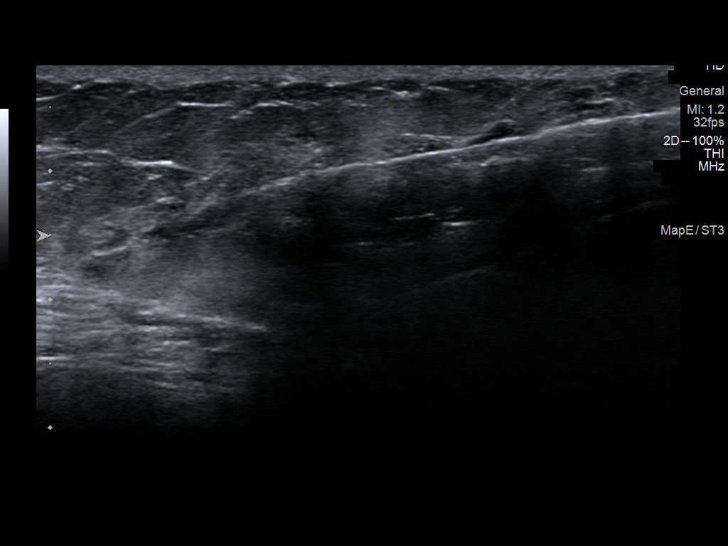
[im 6/11]
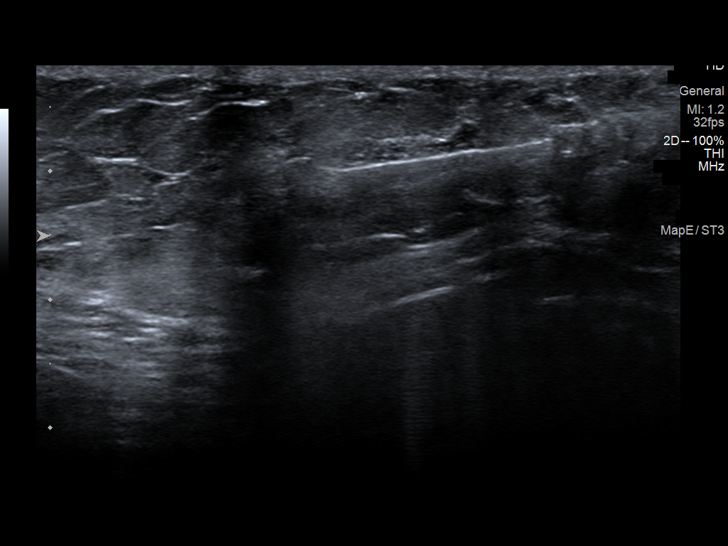
[im 7/11]
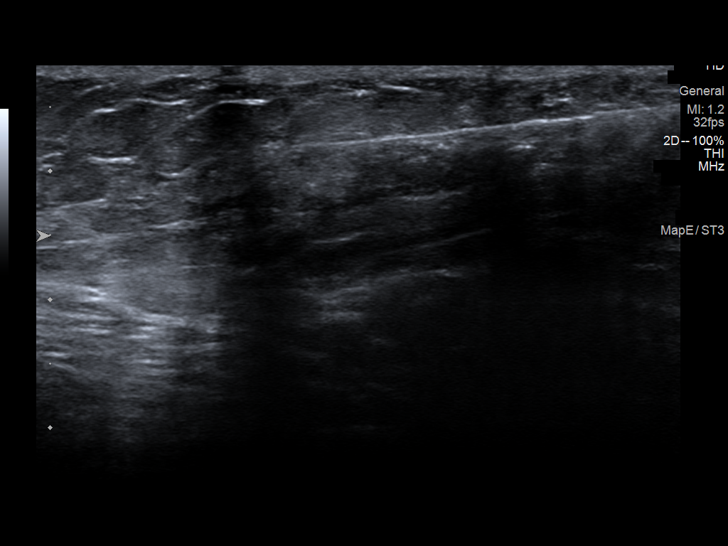
[im 8/11]
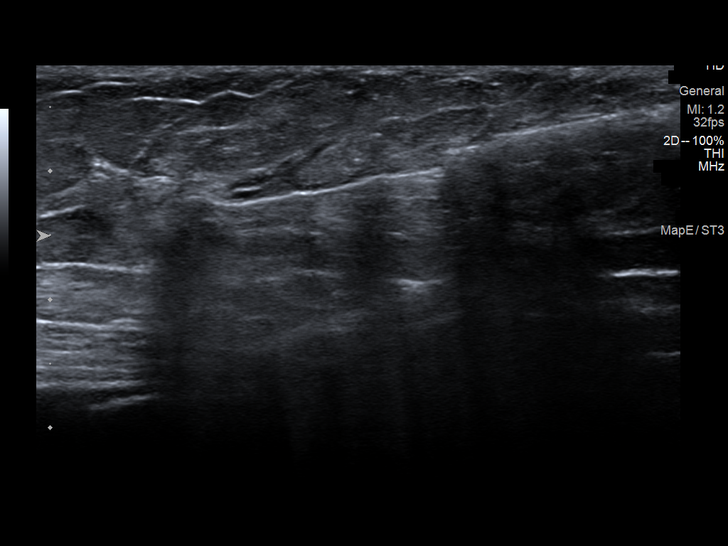
[im 9/11]
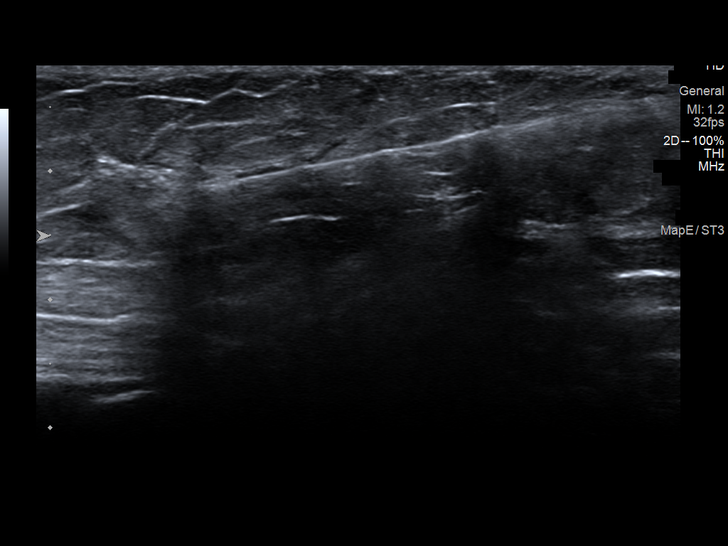
[im 10/11]
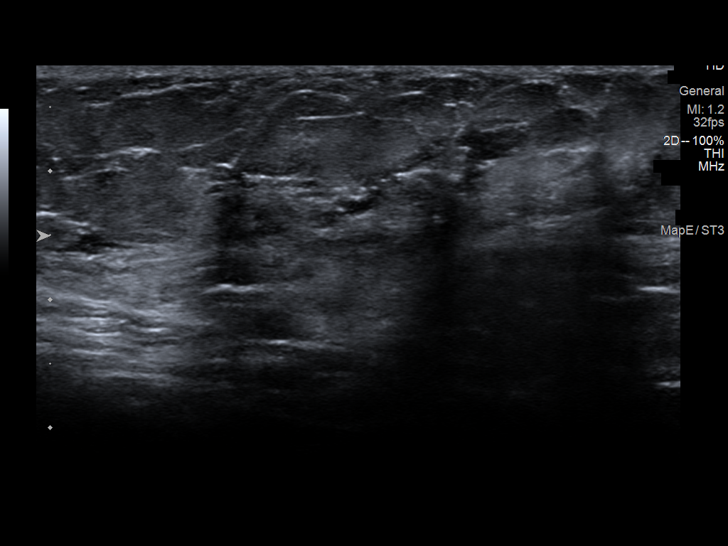
[im 11/11]
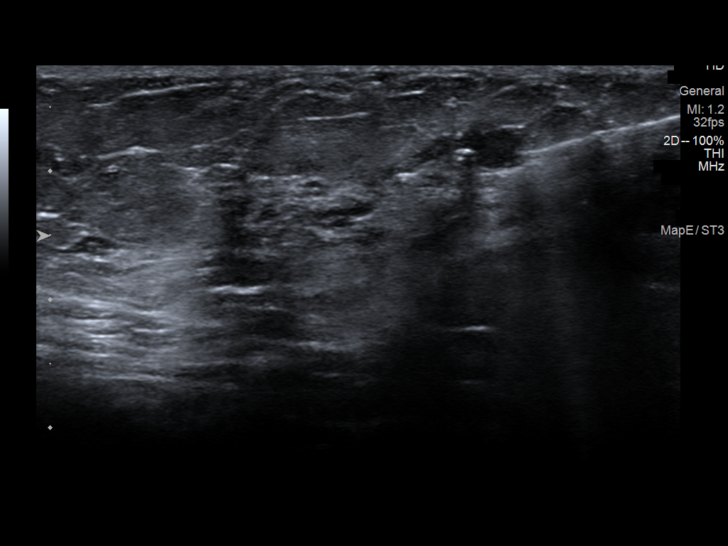

[11 of 11 positions shown; findings below may reference images not displayed]



Lesion quadrant: Lower inner quadrant

Using sterile technique and 1% Lidocaine as local anesthetic, under
direct ultrasound visualization, a 14 gauge Surti device was
used to perform biopsy of a mass with calcifications in the right
breast at 4 o'clock, 10 cm from the nipple using an inferomedial
approach. At the conclusion of the procedure a ribbon shaped tissue
marker clip was deployed into the biopsy cavity. Follow up 2 view
mammogram was performed and dictated separately.
IMPRESSION: Ultrasound guided biopsy of a mass with calcifications in the right
breast at 4 o'clock, 10 cm from the nipple. No apparent
complications.

:
Pathology revealed DUCTAL CARCINOMA IN SITU, HIGH-GRADE WITH
NECROSIS AND CALCIFICATIONS of the Right breast, 4 o'clock, 12cmfn,
(ribbon clip). This was found to be concordant by Dr. Uriel
Atholang.

Pathology revealed DUCTAL CARCINOMA IN SITU, INTERMEDIATE TO
HIGH-GRADE WITH NECROSIS AND CALCIFICATIONS of the Right breast,
lower inner anterior, (X shaped clip). This was found to be
concordant by Dr. Madhav Post.

Pathology results were discussed with the patient by telephone. The
patient reported doing well after the biopsies with tenderness at
the sites. Post biopsy instructions and care were reviewed and
questions were answered. The patient was encouraged to call The
direct phone number was provided.

The patient was referred to [REDACTED]
[REDACTED] at [REDACTED] on
January 23, 2021.

If breast conservation is being considered, additional biopsies are
recommended. This could include US biopsy of the mass in the Right
breast at [DATE], 8 cmfn, calcifications in the upper outer Right
breast, and/or stereotactic biopsy of the asymmetry in the upper
outer Right breast.

Consideration for a bilateral breast MRI given the broad extent of
disease and multiple findings before perusing additional biopsies to
look for the most suspicious target(s) for biopsy.

Pathology results reported by Mrii Belka, RN on 01/17/2021.



Lesion quadrant: Lower inner quadrant

Using sterile technique and 1% Lidocaine as local anesthetic, under
direct ultrasound visualization, a 14 gauge Surti device was
used to perform biopsy of a mass with calcifications in the right
breast at 4 o'clock, 10 cm from the nipple using an inferomedial
approach. At the conclusion of the procedure a ribbon shaped tissue
marker clip was deployed into the biopsy cavity. Follow up 2 view
mammogram was performed and dictated separately.
IMPRESSION: Ultrasound guided biopsy of a mass with calcifications in the right
breast at 4 o'clock, 10 cm from the nipple. No apparent
complications.

## 2023-06-17 DIAGNOSIS — I10 Essential (primary) hypertension: Secondary | ICD-10-CM | POA: Diagnosis not present

## 2023-06-17 DIAGNOSIS — R7303 Prediabetes: Secondary | ICD-10-CM | POA: Diagnosis not present

## 2023-06-17 DIAGNOSIS — D0511 Intraductal carcinoma in situ of right breast: Secondary | ICD-10-CM | POA: Diagnosis not present

## 2023-06-17 DIAGNOSIS — E785 Hyperlipidemia, unspecified: Secondary | ICD-10-CM | POA: Diagnosis not present

## 2023-06-17 DIAGNOSIS — H539 Unspecified visual disturbance: Secondary | ICD-10-CM | POA: Diagnosis not present

## 2023-06-17 DIAGNOSIS — N814 Uterovaginal prolapse, unspecified: Secondary | ICD-10-CM | POA: Diagnosis not present

## 2023-06-28 ENCOUNTER — Emergency Department (HOSPITAL_COMMUNITY)
Admission: EM | Admit: 2023-06-28 | Discharge: 2023-06-28 | Disposition: A | Discharge status: Home / Self Care | Payer: Medicare PPO | Attending: Emergency Medicine | Admitting: Emergency Medicine

## 2023-06-28 ENCOUNTER — Encounter (HOSPITAL_COMMUNITY): Payer: Self-pay

## 2023-06-28 ENCOUNTER — Other Ambulatory Visit: Payer: Self-pay

## 2023-06-28 ENCOUNTER — Ambulatory Visit (HOSPITAL_COMMUNITY)
Admission: EM | Admit: 2023-06-28 | Discharge: 2023-06-28 | Disposition: A | Discharge status: Home / Self Care | Payer: Medicare PPO

## 2023-06-28 ENCOUNTER — Emergency Department (HOSPITAL_COMMUNITY): Payer: Medicare PPO

## 2023-06-28 DIAGNOSIS — Z79899 Other long term (current) drug therapy: Secondary | ICD-10-CM | POA: Insufficient documentation

## 2023-06-28 DIAGNOSIS — S0081XA Abrasion of other part of head, initial encounter: Secondary | ICD-10-CM | POA: Insufficient documentation

## 2023-06-28 DIAGNOSIS — Z853 Personal history of malignant neoplasm of breast: Secondary | ICD-10-CM | POA: Diagnosis not present

## 2023-06-28 DIAGNOSIS — Z7982 Long term (current) use of aspirin: Secondary | ICD-10-CM | POA: Diagnosis not present

## 2023-06-28 DIAGNOSIS — I6381 Other cerebral infarction due to occlusion or stenosis of small artery: Secondary | ICD-10-CM | POA: Diagnosis not present

## 2023-06-28 DIAGNOSIS — W01198A Fall on same level from slipping, tripping and stumbling with subsequent striking against other object, initial encounter: Secondary | ICD-10-CM | POA: Diagnosis not present

## 2023-06-28 DIAGNOSIS — G4489 Other headache syndrome: Secondary | ICD-10-CM

## 2023-06-28 DIAGNOSIS — R42 Dizziness and giddiness: Secondary | ICD-10-CM

## 2023-06-28 DIAGNOSIS — I10 Essential (primary) hypertension: Secondary | ICD-10-CM | POA: Diagnosis not present

## 2023-06-28 DIAGNOSIS — W19XXXA Unspecified fall, initial encounter: Secondary | ICD-10-CM

## 2023-06-28 DIAGNOSIS — S199XXA Unspecified injury of neck, initial encounter: Secondary | ICD-10-CM | POA: Diagnosis not present

## 2023-06-28 DIAGNOSIS — S0990XA Unspecified injury of head, initial encounter: Secondary | ICD-10-CM | POA: Diagnosis not present

## 2023-06-28 NOTE — ED Triage Notes (Signed)
Pt c.o fall on Friday afternoon, pt tripped and fell onto concrete. Pt has abrasion to right forehead and left knee. Pt denies taking any blood thinners. Pt c.o feeling lightheaded

## 2023-06-28 NOTE — ED Triage Notes (Addendum)
Pt presents to urgent care after a fall on Friday 06/26/23, denies LOC, states she hit the right side of her forehead on concrete, "little to no bleeding reported." Pt also "scraped left knee & scratched her left middle finger." Pt states "I tried to catch myself from falling. I tripped trying to get out the door." Pt reports a "light headache" at this time. Pt currently rates her headache pain as a 6/10. Pt states she took two aspirin after fall. Pt denies dizziness.   Pt currently has a Band-Aid on right side of forehead (Neosporin applied) and on left knee. No bleeding present.

## 2023-06-28 NOTE — Discharge Instructions (Signed)
You were seen in the ER after a fall with head injury.  Your CT imaging showed no broken bones or internal bleeding of your head or neck. I recommend taking tylenol as needed for pain and getting plenty of rest the next few days.  Continue to monitor how you're doing and return to the ER for new or worsening symptoms.

## 2023-06-28 NOTE — Discharge Instructions (Addendum)
Given the headache and lightheadedness after your fall with hitting your head we recommend further evaluation at the emergency room.

## 2023-06-28 NOTE — ED Notes (Signed)
Patient is being discharged from the Urgent Care and sent to the Emergency Department via self . Per provider, patient is in need of higher level of care due to head trauma from a fall. Patient is aware and verbalizes understanding of plan of care.  Vitals:   06/28/23 1228  BP: (!) 179/84  Pulse: 73  Resp: 18  Temp: 98.3 F (36.8 C)  SpO2: 98%

## 2023-06-28 NOTE — ED Provider Notes (Signed)
[facility]    CSN: [id number] Arrival date & time: 06/28/23  1031      History   Chief Complaint Chief Complaint  Patient presents with   Fall    HPI Victoria Huang is a 77 y.o. female.   77 year old female who fell on Friday and landed on her left knee then tried to brace with arms but hit head on concrete. Did not have LOC. Only had achy pain on skin like a burning type pain but no headache at that time. Had a little headache yesterday that has worsened today. Some mild light headedness when turning her head. Did hit her left knee but other then an abrasion she has had no other pain, no locking, no giving out, no swelling.  She drove herself here and had no difficulties.  She is having no difficulty walking or talking.  She denies any visual changes, numbness, tingling.   Fall Associated symptoms include headaches. Pertinent negatives include no chest pain, no abdominal pain and no shortness of breath.    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      Home Medications    Prior to Admission medications   Medication Sig Start Date End Date Taking? Authorizing Provider  aspirin EC 81 MG tablet Take 1  tablet by mouth daily Oral 05/21/18  Yes [provider]  albuterol (VENTOLIN HFA) 108 (90 Base) MCG/ACT inhaler Inhale 1-2 puffs into the lungs every 6 (six) hours as needed for wheezing or shortness of breath. 07/03/21   Coralyn Mark, NP  amLODipine (NORVASC) 5 MG tablet Take 1 tablet by mouth at bedtime. 12/17/20   [provider]  rosuvastatin (CRESTOR) 20 MG tablet Take 20 mg by mouth at bedtime. 01/04/21   [provider]  tamoxifen (NOLVADEX) 20 MG tablet Take 1 tablet (20 mg total) by mouth daily. 01/21/23   Serena Croissant, MD    Family History Family History  Problem Relation Age of Onset   Lung cancer Father        dx 36s; smoking hx   Breast cancer Sister 77   Prostate cancer Brother        dx unknown age   Cancer Brother        unknown type; dx after 18   Breast cancer Daughter 81    Social History Social History   Tobacco Use   Smoking status: Never   Smokeless tobacco: Never  Vaping Use   Vaping status: Never Used  Substance Use Topics   Alcohol use: Yes    Comment: social   Drug use: Never     Allergies   Other   Review of  Systems Review of Systems  Constitutional:  Negative for chills and fever.  HENT:  Negative for ear pain and sore throat.   Eyes:  Negative for pain and visual disturbance.  Respiratory:  Negative for cough and shortness of breath.   Cardiovascular:  Negative for chest pain and palpitations.  Gastrointestinal:  Negative for abdominal pain and vomiting.  Genitourinary:  Negative for dysuria and hematuria.  Musculoskeletal:  Negative for arthralgias and back pain.  Skin:  Negative for color change and rash.  Neurological:  Positive for light-headedness and headaches. Negative for seizures and syncope.  All other systems reviewed and are negative.    Physical Exam Triage Vital Signs ED Triage Vitals  Encounter Vitals Group     BP 06/28/23 1228 (!) 179/84     Systolic BP Percentile --       Diastolic BP Percentile --      Pulse Rate 06/28/23 1228 73     Resp 06/28/23 1228 18     Temp 06/28/23 1228 98.3 F (36.8 C)     Temp Source 06/28/23 1228 Oral     SpO2 06/28/23 1228 98 %     Weight 06/28/23 1230 150 lb (68 kg)     Height --      Head Circumference --      Peak Flow --      Pain Score 06/28/23 1227 6     Pain Loc --      Pain Education --      Exclude from Growth Chart --    No data found.  Updated Vital Signs BP (!) 179/84 (BP Location: Right Arm) Comment: pt reports taking her BP medication today.  Pulse 73   Temp 98.3 F (36.8 C) (Oral)   Resp 18   Wt 150 lb (68 kg)   SpO2 98%   BMI 26.57 kg/m   Visual Acuity Right Eye Distance:   Left Eye Distance:   Bilateral Distance:    Right Eye Near:   Left Eye Near:    Bilateral Near:     Physical Exam Vitals and nursing note reviewed.  Constitutional:      General: She is not in acute distress.    Appearance: She is well-developed.  HENT:     Head: Normocephalic and atraumatic.  Eyes:     Conjunctiva/sclera: Conjunctivae normal.  Cardiovascular:     Rate and Rhythm: Normal rate and regular rhythm.     Heart sounds: No murmur heard. Pulmonary:     Effort: Pulmonary effort is normal. No respiratory distress.     Breath sounds: Normal breath sounds.  Abdominal:     Palpations: Abdomen is soft.     Tenderness: There is no abdominal tenderness.  Musculoskeletal:        General: No swelling.     Cervical back: Neck supple.  Skin:    General: Skin is warm and dry.     Capillary Refill: Capillary refill takes less than 2 seconds.  Neurological:     Mental Status: She is alert.  Psychiatric:        Mood and Affect: Mood normal.      UC Treatments / Results  Labs (all labs ordered are listed, but only abnormal results are displayed) Labs Reviewed - No data to display  EKG   Radiology No results found.  Procedures Procedures (including critical care time)  Medications Ordered in  UC Medications - No data to display  Initial Impression / Assessment and  Plan / UC Course  I have reviewed the triage vital signs and the nursing notes.  Pertinent labs & imaging results that were available during my care of the patient were reviewed by me and considered in my medical decision making (see chart for details).     Fall, initial encounter  Injury of head, initial encounter  Other headache syndrome  Lightheadedness   Long discussion with the patient.  Likely she has a mild concussion but I advised her that I cannot give her 100% guarantees that there is not an intracranial process.  I did advise her that this is unlikely given that she has been able to drive, ambulate and have a normal neurological exam.  The patient has been taking aspirin which only slightly increases her risk.  The patient would like to have further evaluation given her symptoms which is reasonable given her age and medication.  Patient will go to the emergency room for further evaluation.  As the patient has a normal neurological exam and drove here I think it is reasonable for her to go to the emergency room in our parking lot.  I advised her that she should contact her family to let them know what is going on.  Final Clinical Impressions(s) / UC Diagnoses   Final diagnoses:  None   Discharge Instructions   None    ED Prescriptions   None    PDMP not reviewed this encounter.   Landis Martins, [place] 06/28/23 1258

## 2023-06-28 NOTE — ED Provider Notes (Signed)
[place] EMERGENCY DEPARTMENT AT [facility] Provider Note   CSN: [id number] Arrival date & time: 06/28/23  1320     History  Chief Complaint  Patient presents with   Victoria Huang is a 77 y.o. female with history of breast cancer, hypertension, hyperlipidemia, who presents the emergency department after a fall with head trauma.  Patient states that she was leaving work 2 nights ago, when she tripped and fell on concrete.  She sustained an abrasion to the right side of her forehead as well as her left knee.  Did not lose consciousness.  She was able to get up herself and walk away.  She has been feeling a little bit lightheaded, but does not feel like she is going to pass out.  No blurry vision, nausea or vomiting.  She maintains full memory of the event.  She has been able to walk normally, and her left knee just feels a little sore.  She does not take any blood thinners.   Fall       Home Medications Prior to Admission medications   Medication Sig Start Date End Date Taking? Authorizing Provider  albuterol (VENTOLIN HFA) 108 (90 Base) MCG/ACT inhaler Inhale 1-2 puffs into the lungs every 6 (six) hours as needed for wheezing or shortness of breath. 07/03/21   Coralyn Mark, NP  amLODipine (NORVASC) 5 MG tablet Take 1 tablet by mouth at bedtime. 12/17/20   [provider]  aspirin EC 81 MG tablet Take 1 tablet by mouth daily Oral 05/21/18   [provider]  rosuvastatin (CRESTOR) 20 MG tablet Take 20 mg by mouth at bedtime. 01/04/21   [provider]  tamoxifen (NOLVADEX) 20 MG tablet Take 1 tablet (20 mg total) by mouth daily. 01/21/23   Serena Croissant, MD      Allergies    Other    Review of Systems   Review of Systems  Musculoskeletal:  Positive for arthralgias.  Skin:  Positive for wound.  Neurological:  Positive for light-headedness.  All other systems reviewed and are negative.   Physical Exam Updated Vital  Signs BP (!) 176/94 (BP Location: Right Arm)   Pulse 77   Temp 98.4 F (36.9 C)   Resp 18   Ht 5\' 3"  (1.6 m)   Wt 68 kg   SpO2 100%   BMI 26.57 kg/m  Physical Exam Vitals and nursing note reviewed.  Constitutional:      Appearance: Normal appearance.  HENT:     Head: Normocephalic.      Comments: Small abrasion to the right forehead, about 1.5 cm in diameter, partially healed, bleeding controlled Eyes:     Conjunctiva/sclera: Conjunctivae normal.  Neck:     Comments: No midline spinal tenderness or deformities palpated Pulmonary:     Effort: Pulmonary effort is normal. No respiratory distress.  Musculoskeletal:     Comments: Abrasion over the left knee, no deformities or tenderness of the knee palpated, normal ROM, compartments of lower extremities are soft  Skin:    General: Skin is warm and dry.  Neurological:     Mental Status: She is alert.  Psychiatric:        Mood and Affect: Mood normal.        Behavior: Behavior normal.     ED Results / Procedures / Treatments   Labs (all labs ordered are listed, but only abnormal results are displayed) Labs Reviewed - No data to display  EKG None  Radiology CT Head Wo Contrast  Result Date: 06/28/2023 CLINICAL DATA:  Trauma EXAM: CT HEAD WITHOUT CONTRAST CT CERVICAL SPINE WITHOUT CONTRAST TECHNIQUE: Multidetector CT imaging of the head and cervical spine was performed following the standard protocol without intravenous contrast. Multiplanar CT image reconstructions of the cervical spine were also generated. RADIATION DOSE REDUCTION: This exam was performed according to the departmental dose-optimization program which includes automated exposure control, adjustment of the mA and/or kV according to patient size and/or use of iterative reconstruction technique. COMPARISON:  None Available. FINDINGS: CT HEAD FINDINGS Brain: No evidence of acute infarction, hemorrhage, hydrocephalus, extra-axial collection or mass lesion/mass  effect. 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 Vascular: No hyperdense vessel or unexpected calcification. Skull: Normal. Negative for fracture or focal lesion. Sinuses/Orbits: No acute finding. Other: None. CT CERVICAL SPINE FINDINGS Alignment: Normal. Skull base and vertebrae: No acute fracture. No primary bone lesion or focal pathologic process. Soft tissues and spinal canal: No prevertebral fluid or swelling. No visible canal hematoma. Disc levels: Focally mild disc space height loss and osteophytosis of C5-C6 with otherwise intact disc spaces. Upper chest: Negative. Other: None. IMPRESSION: 1. No acute intracranial pathology. 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 2. No fracture or static subluxation of the cervical spine. 3. Focally mild disc space height loss and osteophytosis of C5-C6 with otherwise intact disc spaces. Electronically Signed   By: Jearld Lesch M.D.   On: 06/28/2023 15:59   CT Cervical Spine Wo Contrast  Result Date: 06/28/2023 CLINICAL DATA:  Trauma EXAM: CT HEAD WITHOUT CONTRAST CT CERVICAL SPINE WITHOUT CONTRAST TECHNIQUE: Multidetector CT imaging of the head and cervical spine was performed following the standard protocol without intravenous contrast. Multiplanar CT image reconstructions of the cervical spine were also generated. RADIATION DOSE REDUCTION: This exam was performed according to the departmental dose-optimization program which includes automated exposure control, adjustment of the mA and/or kV according to patient size and/or use of iterative reconstruction technique. COMPARISON:  None Available. FINDINGS: CT HEAD FINDINGS Brain: No evidence of acute infarction, hemorrhage, hydrocephalus, extra-axial collection or mass lesion/mass effect. 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 Vascular: No hyperdense vessel or unexpected calcification.  Skull: Normal. Negative for fracture or focal lesion. Sinuses/Orbits: No acute finding. Other: None. CT CERVICAL SPINE FINDINGS Alignment: Normal. Skull base and vertebrae: No acute fracture. No primary bone lesion or focal pathologic process. Soft tissues and spinal canal: No prevertebral fluid or swelling. No visible canal hematoma. Disc levels: Focally mild disc space height loss and osteophytosis of C5-C6 with otherwise intact disc spaces. Upper chest: Negative. Other: None. IMPRESSION: 1. No acute intracranial pathology. 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 2. No fracture or static subluxation of the cervical spine. 3. Focally mild disc space height loss and osteophytosis of C5-C6 with otherwise intact disc spaces. Electronically Signed   By: Jearld Lesch M.D.   On: 06/28/2023 15:59    Procedures Procedures    Medications Ordered in ED Medications - No data to display  ED Course/ Medical Decision Making/ A&P                                 Medical Decision Making Amount and/or Complexity of Data Reviewed Radiology: ordered.   This patient is a 77 y.o. female  who  presents to the ED for concern of fall with head trauma 2 days ago, not on blood thinners.   Differential diagnoses prior to evaluation: The emergent differential diagnosis includes, but is not limited to,  fractures, dislocations, intracranial bleeding. This is not an exhaustive differential.   Past Medical History / Co-morbidities / Social History: breast cancer, hypertension, hyperlipidemia Reviewed UC visit note from this AM, patient was sent to ER for further evaluation and imaging.   Physical Exam: Physical exam performed. The pertinent findings include: Abrasions to the right forehead and left knee. Moving all extremities normally, neurovascularly intact. No cervical midline tenderness, step offs or crepitus.   Lab Tests/Imaging studies: I personally interpreted labs/imaging and  the pertinent results include:  CT head and cervical spine without acute abnormalities. I agree with the radiologist interpretation.   Disposition: After consideration of the diagnostic results and the patients response to treatment, I feel that emergency department workup does not suggest an emergent condition requiring admission or immediate intervention beyond what has been performed at this time. The plan is: discharge to home . The patient is safe for discharge and has been instructed to return immediately for worsening symptoms, change in symptoms or any other concerns.  Final Clinical Impression(s) / ED Diagnoses Final diagnoses:  Fall, initial encounter  Injury of head, initial encounter    Rx / DC Orders ED Discharge Orders     None      Portions of this report may have been transcribed using voice recognition software. Every effort was made to ensure accuracy; however, inadvertent computerized transcription errors may be present.    Jeanella Flattery 06/28/23 1612    Derwood Kaplan, MD 07/01/23 1514

## 2024-02-25 IMAGING — MG MM DIGITAL DIAGNOSTIC UNILAT*L* W/ TOMO W/ CAD
4 series · 6 of 12 positions shown · non-contrast
Comparison: Previous exam(s).
COMPARISON: Previous exam(s).

Addendum:
CLINICAL DATA: Right lumpectomy in 6466. Palpable lump on the
right. Annual mammography on the left.

EXAM:
DIGITAL DIAGNOSTIC UNILATERAL LEFT MAMMOGRAM WITH TOMOSYNTHESIS AND
CAD; ULTRASOUND RIGHT BREAST LIMITED
TECHNIQUE: Left digital diagnostic mammography and breast tomosynthesis was
performed. The images were evaluated with computer-aided detection.;
Targeted ultrasound examination of the right breast was performed

[L MLO synth-2D]
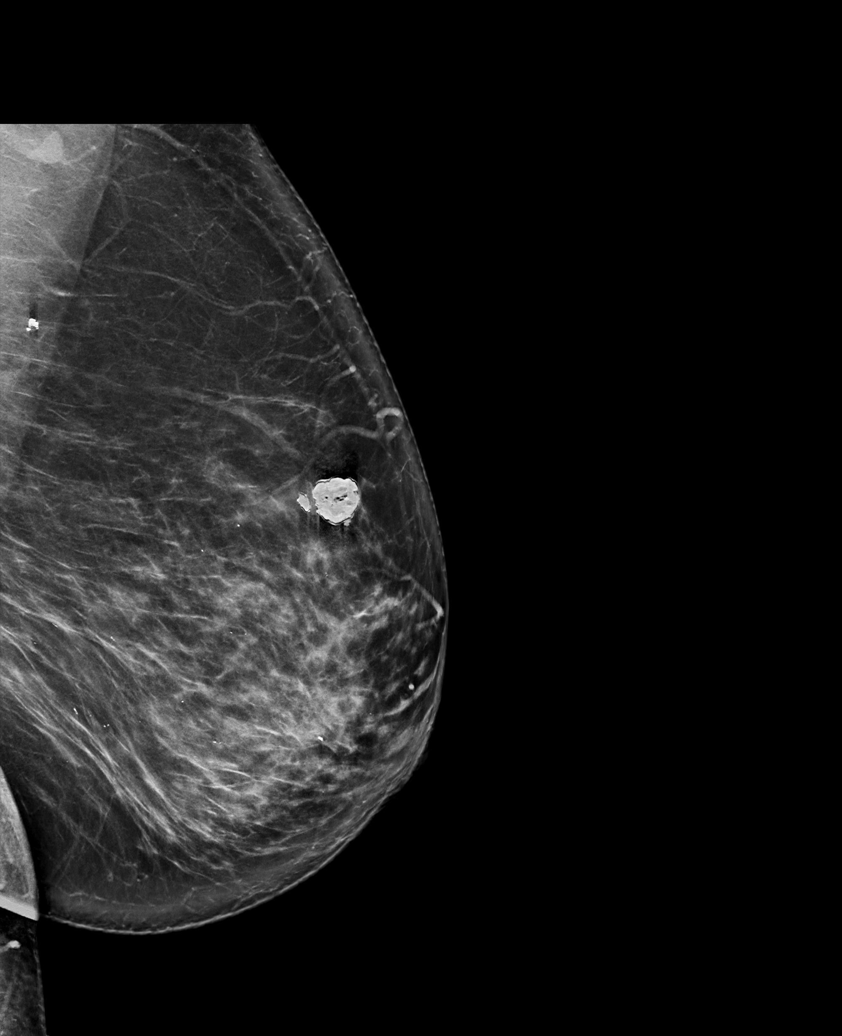

[L CC synth-2D]
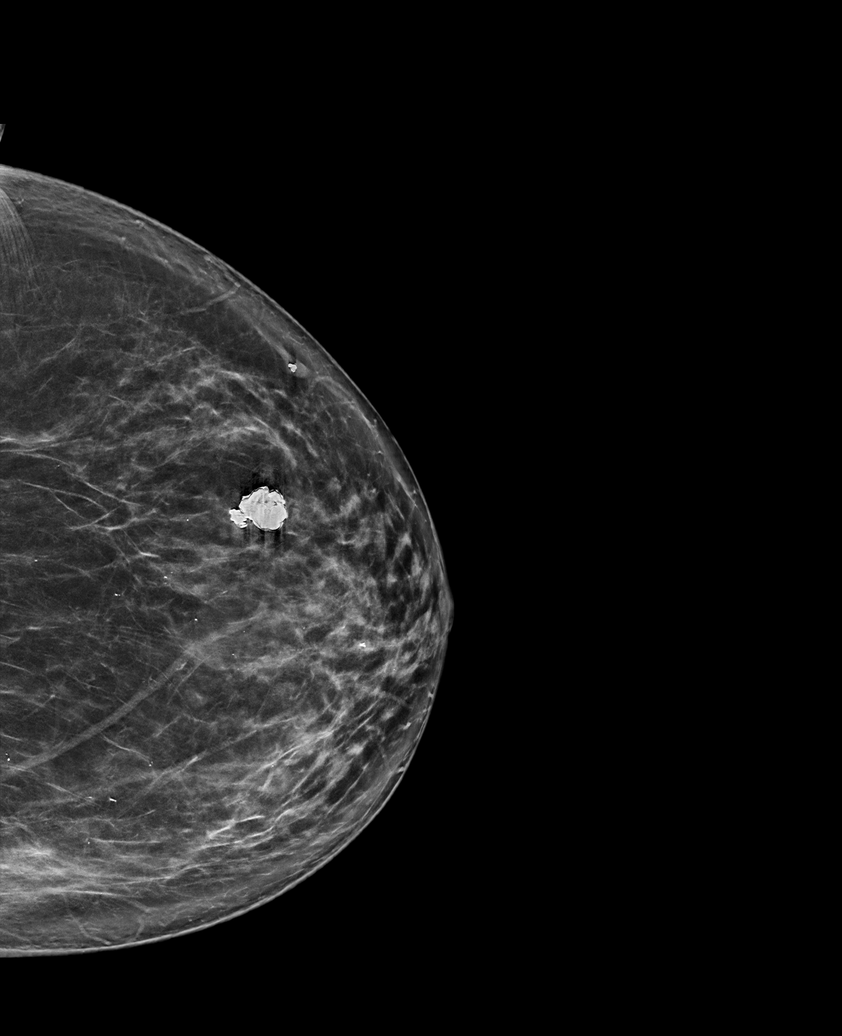

[L CC tomo · 3 of 81 frames shown]
[frame 27/81]
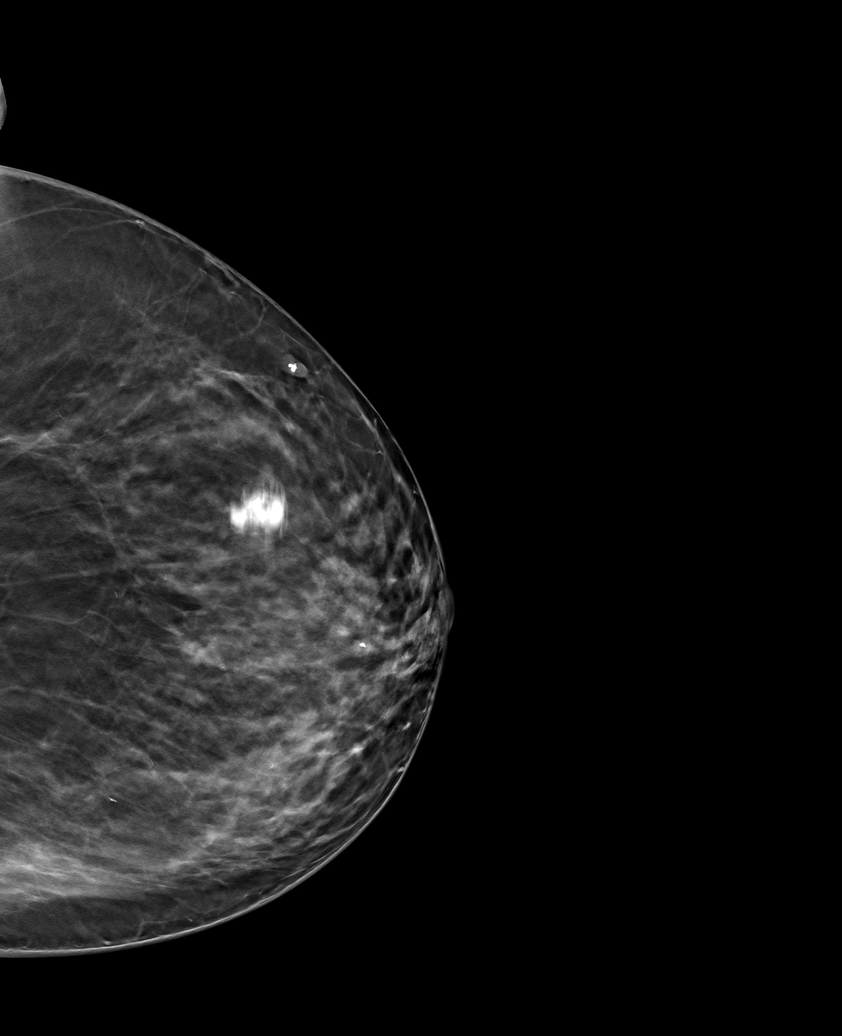
[frame 41/81]
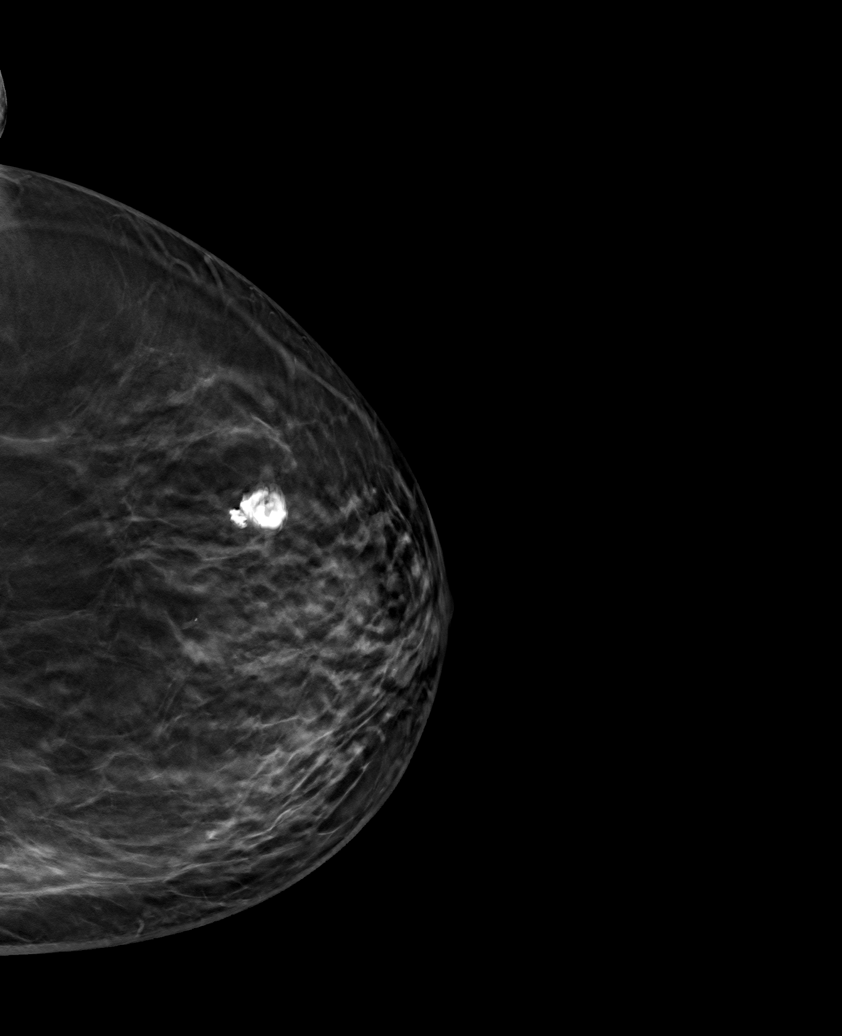
[frame 55/81]
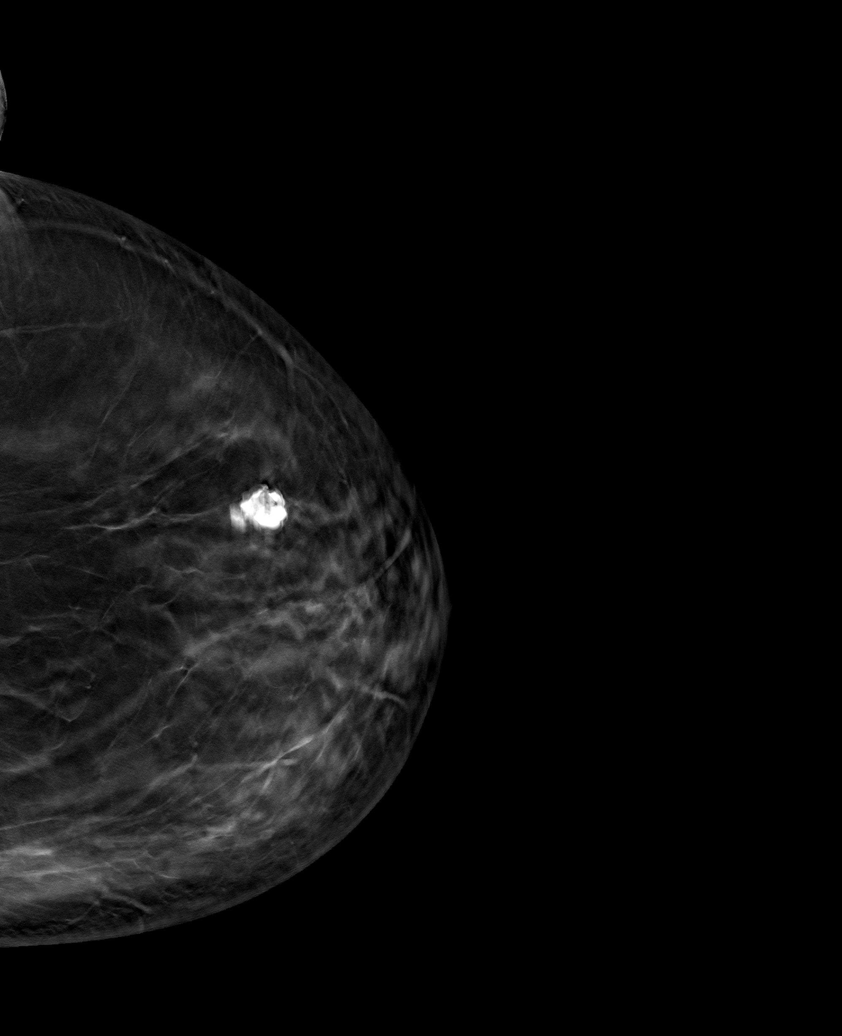

[L MLO tomo · tomo slice 45/89.0]
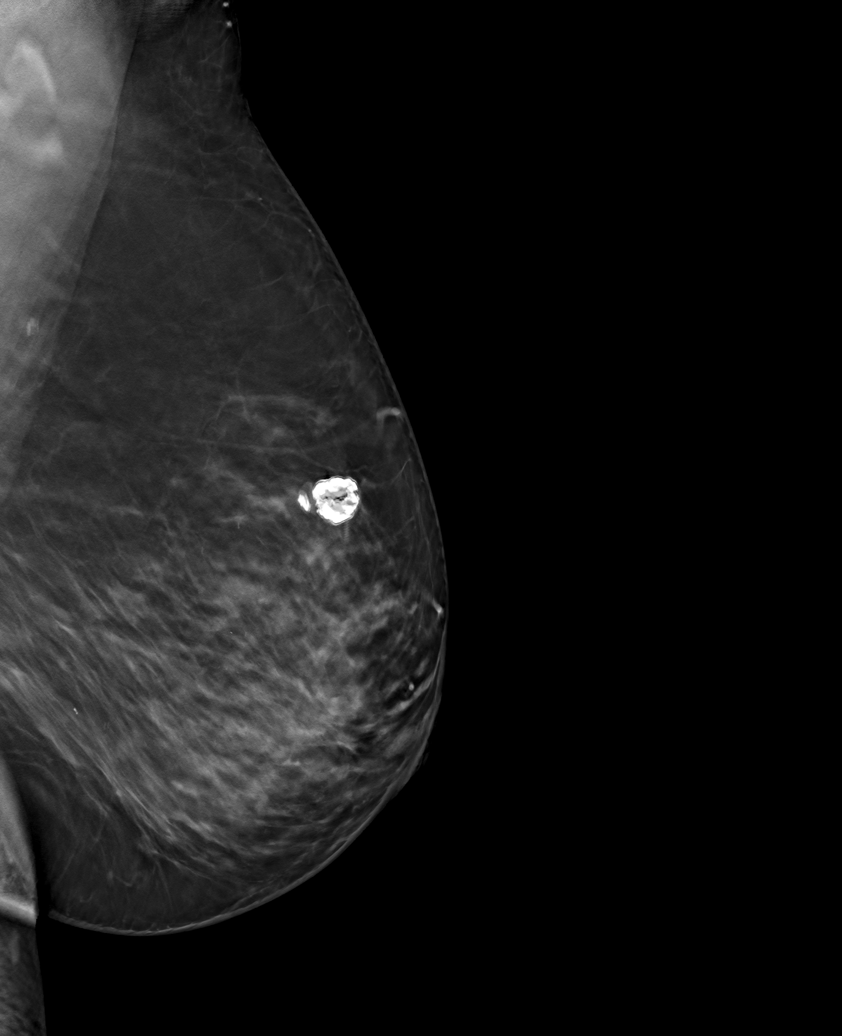

[6 of 12 positions shown; findings below may reference images not displayed]

ACR Breast Density Category c: The breast tissue is heterogeneously
dense, which may obscure small masses.
FINDINGS: No suspicious masses, calcifications, or distortion are identified
on the left.

On physical exam, no suspicious discrete lumps are identified on the
right.

Targeted ultrasound is performed, showing no sonographic evidence of
malignancy on the right.
IMPRESSION: No mammographic evidence of malignancy on the left. No sonographic
evidence of malignancy on the right.

RECOMMENDATION:
Treatment of the patient's symptoms should be based on clinical and
physical exam given lack of imaging findings. Recommend annual
screening mammography on the left.

I have discussed the findings and recommendations with the patient.
If applicable, a reminder letter will be sent to the patient
regarding the next appointment.

BI-RADS CATEGORY  2: Benign.

ADDENDUM:
Right mastectomy 6466

*** End of Addendum ***
ACR Breast Density Category c: The breast tissue is heterogeneously
dense, which may obscure small masses.
FINDINGS: No suspicious masses, calcifications, or distortion are identified
on the left.

On physical exam, no suspicious discrete lumps are identified on the
right.

Targeted ultrasound is performed, showing no sonographic evidence of
malignancy on the right.
IMPRESSION: No mammographic evidence of malignancy on the left. No sonographic
evidence of malignancy on the right.

RECOMMENDATION:
Treatment of the patient's symptoms should be based on clinical and
physical exam given lack of imaging findings. Recommend annual
screening mammography on the left.

I have discussed the findings and recommendations with the patient.
If applicable, a reminder letter will be sent to the patient
regarding the next appointment.

BI-RADS CATEGORY  2: Benign.

## 2024-04-06 ENCOUNTER — Other Ambulatory Visit: Payer: Self-pay | Admitting: Internal Medicine

## 2024-04-06 DIAGNOSIS — Z1231 Encounter for screening mammogram for malignant neoplasm of breast: Secondary | ICD-10-CM

## 2024-04-12 ENCOUNTER — Encounter: Payer: Self-pay | Admitting: Obstetrics and Gynecology

## 2024-04-12 ENCOUNTER — Other Ambulatory Visit: Payer: Self-pay

## 2024-04-12 ENCOUNTER — Ambulatory Visit: Admitting: Obstetrics and Gynecology

## 2024-04-12 VITALS — BP 144/66 | HR 76 | Wt 146.0 lb

## 2024-04-12 DIAGNOSIS — N958 Other specified menopausal and perimenopausal disorders: Secondary | ICD-10-CM | POA: Diagnosis not present

## 2024-04-12 DIAGNOSIS — Z1331 Encounter for screening for depression: Secondary | ICD-10-CM

## 2024-04-12 MED ORDER — ESTRADIOL 0.1 MG/GM VA CREA: TOPICAL_CREAM | VAGINAL | 12 refills | Status: AC

## 2024-04-12 NOTE — Progress Notes (Unsigned)
 NEW GYNECOLOGY PATIENT Patient name: Victoria Huang MRN [id number]  Date of birth: May 01, 1946 Chief Complaint:   Gynecologic Exam     History:  Discussed the use of AI scribe software for clinical note transcription with the patient, who gave verbal consent to proceed.  History of Present Illness Victoria Huang is a 78 year old female who presents with pelvic pressure and changes in urine appearance.  She has experienced persistent pelvic pressure for the past two to three years, describing it as a constant sensation without any associated vaginal prolapse. No vaginal bleeding is present, but she notes a white discharge that she feels alters the appearance of her urine, making it whitish.  She experiences a monthly occurrence of an unusual smell and a sensation of something growing inside, which she describes as a 'fiber inside the vagina area.' Despite these sensations, she denies any difficulty with urination or bladder emptying but does report urgency, needing to urinate immediately to avoid accidents.  She uses boric acid suppositories to manage the smell but finds insertion difficult, feeling that 'something needs to move' to insert them fully.  Her bowel habits remain regular, aided by a high-fiber diet including salads and coffee.      Gynecologic History No LMP recorded. Patient is postmenopausal. Contraception: post menopausal status   OB History  No obstetric history on file.     The following portions of the patient's history were reviewed and updated as appropriate: allergies, current medications, past family history, past medical history, past social history, past surgical history and problem list. Health Maintenance  Topic Date Due   Hepatitis C Screening  Never done   DTaP/Tdap/Td vaccine (1 - Tdap) Never done   Zoster (Shingles) Vaccine (1 of 2) Never done   Pneumococcal Vaccine for age over 66 (1 of 1 - PCV) Never done   DEXA scan (bone density  measurement)  Never done   Medicare Annual Wellness Visit  11/18/2023   Flu Shot  03/04/2024   COVID-19 Vaccine (4 - 2025-26 season) 04/04/2024   HPV Vaccine  Aged Out   Meningitis B Vaccine  Aged Out     Review of Systems Pertinent items noted in HPI and remainder of comprehensive ROS otherwise negative.  Physical Exam:  BP (!) 144/66   Pulse 76   Wt 146 lb (66.2 kg)   BMI 25.86 kg/m  Physical Exam Vitals and nursing note reviewed. Exam conducted with a chaperone present.  Constitutional:      Appearance: Normal appearance.  Pulmonary:     Effort: Pulmonary effort is normal.  Abdominal:     Palpations: Abdomen is soft.  Genitourinary:    General: Normal vulva.     Exam position: Lithotomy position.     Comments: Atrophic vaginal introitus and [place]aland urethral meatus  Small urethral caruncle  No prolapse noted on examination Allodynia of introitus No mass palpated on single digital exam ination  Neurological:     Mental Status: She is alert.     Assessment and Plan:   Assessment & Plan Pelvic pressure with vaginal discharge Chronic pelvic pressure with white discharge.  Urinary urgency and urge incontinence Urinary urgency and urge incontinence, likely age-related. Stable urination pattern, no bladder emptying issues. - reviewed postmenopausal changes to vagina and suspect that difficulty with insertion due to atrophy - trial of vaginal estrogen to decrease urinary symptoms as well as vaginitis symptoms -   Follow-up: No follow-ups on file.  Carter Quarry, MD Obstetrician & Gynecologist, Faculty Practice [facility] for [employer], [facility]

## 2024-04-13 ENCOUNTER — Ambulatory Visit
Admission: RE | Admit: 2024-04-13 | Discharge: 2024-04-13 | Disposition: A | Discharge status: Home / Self Care | Source: Ambulatory Visit | Attending: Internal Medicine | Admitting: Internal Medicine

## 2024-04-13 DIAGNOSIS — Z1231 Encounter for screening mammogram for malignant neoplasm of breast: Secondary | ICD-10-CM

## 2024-04-13 LAB — POCT URINALYSIS DIP (DEVICE)
Glucose, UA: NEGATIVE mg/dL
Hgb urine dipstick: NEGATIVE
Ketones, ur: NEGATIVE mg/dL
Leukocytes,Ua: NEGATIVE
Nitrite: NEGATIVE
Protein, ur: NEGATIVE mg/dL
Specific Gravity, Urine: 1.02 (ref 1.005–1.030)
Urobilinogen, UA: 0.2 mg/dL (ref 0.0–1.0)
pH: 6 (ref 5.0–8.0)

## 2024-05-02 NOTE — BH Specialist Note (Unsigned)
 Integrated Behavioral Health via Telemedicine Visit  05/03/2024 Victoria Huang [id number]  Number of Integrated Behavioral Health Clinician visits: 1- Initial Visit  Session Start time: 0915   Session End time: 0958  Total time in minutes: 43   Referring Provider: Carter Quarry, MD Patient/Family location: Home [facility] Provider location: Center for [facility] at [facility]  All persons participating in visit: Patient Victoria Huang and [facility] Kapono Luhn   Types of Service: Individual psychotherapy and Telephone visit  I connected with Victoria Huang's n/a via  Telephone or Video Enabled Telemedicine Application  (Video is Caregility application) and verified that I am speaking with the correct person using two identifiers. Discussed confidentiality: Yes   I discussed the limitations of telemedicine and the availability of in person appointments.  Discussed there is a possibility of technology failure and discussed alternative modes of communication if that failure occurs.  I discussed that engaging in this telemedicine visit, they consent to the provision of behavioral healthcare and the services will be billed under their insurance.  Patient and/or legal guardian expressed understanding and consented to Telemedicine visit: Yes   Presenting Concerns: Patient and/or family reports the following symptoms/concerns: Increased symptoms of depression and anxiety, primarily attributed to work stress; goal is to quit current workplace position, by investing in default tax property and manage from home. Pt is currently coping by keeping active (walking up to 5-10 miles daily at work, swimming and gym time); open to implementing additional self-coping strategy to utilize at times unable to leave the house.  Duration of problem: Increasing over time; Severity of problem: moderate  Patient and/or Family's Strengths/Protective  Factors: Concrete supports in place (healthy food, safe environments, etc.), Sense of purpose, and Physical Health (exercise, healthy diet, medication compliance, etc.)  Goals Addressed: Patient will:  Reduce symptoms of: anxiety and depression   Increase knowledge and/or ability of: self-management skills   Demonstrate ability to: Increase healthy adjustment to current life circumstances  Progress towards Goals: Ongoing    Interventions: Interventions utilized:  Mindfulness or Management consultant, Functional Assessment of ADLs, and Psychoeducation and/or Health Education Standardized Assessments completed: Not Needed    Patient and/or Family Response: Patient agrees with treatment plan.   Clinical Assessment/Diagnosis  No diagnosis found.   Patient may benefit from psychoeducation and brief therapeutic interventions regarding coping with symptoms of depression and anxiety .  Plan: Follow up with behavioral health clinician on : Two weeks Behavioral recommendations:  -Continue daily self-coping strategies (weekly indoor swimming; weekly gym time; working towards financial/investment goal; time with supportive people in life) -CALM relaxation breathing exercise twice daily (morning; at bedtime with sleep sounds); as needed throughout the day; consider taking blood pressure before and after breathing exercise to see if any impact on blood pressure (at least once) -Consider picking a quit date for work  Referral(s): Integrated [employer] (In Clinic)  I discussed the assessment and treatment plan with the patient and/or parent/guardian. They were provided an opportunity to ask questions and all were answered. They agreed with the plan and demonstrated an understanding of the instructions.   They were advised to call back or seek an in-person evaluation if the symptoms worsen or if the condition fails to improve as anticipated.  Warren BROCKS Khayree Delellis, LCSW      04/12/2024    2:17 PM 06/19/2021   12:03 PM  Depression screen PHQ 2/9  Decreased Interest 2 0  Down, Depressed, Hopeless  3 0  PHQ - 2 Score 5 0  Altered sleeping 2   Tired, decreased energy 2   Change in appetite 0   Feeling bad or failure about yourself  3   Moving slowly or fidgety/restless 0   Suicidal thoughts 0   PHQ-9 Score 12       04/12/2024    2:18 PM  GAD 7 : Generalized Anxiety Score  Nervous, Anxious, on Edge 3  Control/stop worrying 3  Worry too much - different things 3  Trouble relaxing 2  Restless 0  Easily annoyed or irritable 0  Afraid - awful might happen 1  Total GAD 7 Score 12

## 2024-05-03 ENCOUNTER — Ambulatory Visit: Payer: Self-pay | Admitting: Clinical

## 2024-05-03 DIAGNOSIS — F4323 Adjustment disorder with mixed anxiety and depressed mood: Secondary | ICD-10-CM

## 2024-05-03 NOTE — Patient Instructions (Signed)
 Center for [facility] Healthcare at [facility] [address] [place], [place] [address] [phone] (main office) [phone] (Adalina Dopson's office)

## 2024-05-17 ENCOUNTER — Ambulatory Visit: Admitting: Clinical

## 2024-05-17 DIAGNOSIS — F4323 Adjustment disorder with mixed anxiety and depressed mood: Secondary | ICD-10-CM | POA: Diagnosis not present

## 2024-05-17 NOTE — BH Specialist Note (Signed)
 Integrated Behavioral Health via Telemedicine Visit  05/17/2024 [name] [id number]  Number of Integrated Behavioral Health Clinician visits: 2- Second Visit  Session Start time: 1025   Session End time: 1049  Total time in minutes: 24  Referring Provider: Carter Quarry, MD Patient/Family location: Home [facility] Provider location: Center for [facility] at [facility]  All persons participating in visit: Patient Victoria Huang and Victoria Huang   Types of Service: Individual psychotherapy and Telephone visit  I connected with Victoria Huang and/or Victoria Huang's n/a via  Telephone or Video Enabled Telemedicine Application  (Video is Caregility application) and verified that I am speaking with the correct person using two identifiers. Discussed confidentiality: Yes   I discussed the limitations of telemedicine and the availability of in person appointments.  Discussed there is a possibility of technology failure and discussed alternative modes of communication if that failure occurs.  I discussed that engaging in this telemedicine visit, they consent to the provision of behavioral healthcare and the services will be billed under their insurance.  Patient and/or legal guardian expressed understanding and consented to Telemedicine visit: Yes   Presenting Concerns: Patient and/or family reports the following symptoms/concerns: Considerable mood improvement after new people were hired at work; hours reduced from to up to 16 hour days on the weekends, down to 8 hour days currently. Pt's goal today is to successfully bid on a property; ;using investment to help fund retirement/ability to quit job. Pt is most looking forward to swimming every day in retirement.  Duration of problem: Increasing with current job; Severity of problem: moderate  Patient and/or Family's Strengths/Protective Factors: Social connections, Social and Emotional  competence, Concrete supports in place (healthy food, safe environments, etc.), Sense of purpose, and Physical Health (exercise, healthy diet, medication compliance, etc.)  Goals Addressed: Patient will:  Maintain reduced symptoms of: anxiety and depression    Demonstrate ability to: Increase healthy adjustment to current life circumstances and Increase motivation to adhere to plan of care  Progress towards Goals: Ongoing  Interventions: Interventions utilized:  Motivational Interviewing and Supportive Reflection Standardized Assessments completed: Not Needed  Patient and/or Family Response: Patient agrees with treatment plan.   Clinical Assessment/Diagnosis  Adjustment disorder with mixed anxiety and depressed mood   Patient may benefit from continued therapeutic intervention.  Plan: Follow up with behavioral health clinician on : Call Keysean Savino at [phone], as needed. Behavioral recommendations:  -Continue daily self-coping strategies (at least weekly swimming; weekly gym time; time with supportive people in life; relaxation breathing) -Continue plan to bid on property today; work towards financial/investment goal prior to quitting current job  Referral(s): Integrated [employer] (In Clinic)  I discussed the assessment and treatment plan with the patient and/or parent/guardian. They were provided an opportunity to ask questions and all were answered. They agreed with the plan and demonstrated an understanding of the instructions.   They were advised to call back or seek an in-person evaluation if the symptoms worsen or if the condition fails to improve as anticipated.  Warren BROCKS Geron Mulford, LCSW     04/12/2024    2:17 PM 06/19/2021   12:03 PM  Depression screen PHQ 2/9  Decreased Interest 2 0  Down, Depressed, Hopeless 3 0  PHQ - 2 Score 5 0  Altered sleeping 2   Tired, decreased energy 2   Change in appetite 0   Feeling bad or failure about yourself  3    Moving slowly  or fidgety/restless 0   Suicidal thoughts 0   PHQ-9 Score 12       04/12/2024    2:18 PM  GAD 7 : Generalized Anxiety Score  Nervous, Anxious, on Edge 3  Control/stop worrying 3  Worry too much - different things 3  Trouble relaxing 2  Restless 0  Easily annoyed or irritable 0  Afraid - awful might happen 1  Total GAD 7 Score 12

## 2024-06-21 ENCOUNTER — Ambulatory Visit: Admitting: Obstetrics and Gynecology

## 2024-08-15 ENCOUNTER — Encounter: Payer: Self-pay | Admitting: Obstetrics and Gynecology

## 2024-08-15 ENCOUNTER — Ambulatory Visit: Admitting: Obstetrics and Gynecology

## 2024-08-15 ENCOUNTER — Other Ambulatory Visit: Payer: Self-pay

## 2024-08-15 ENCOUNTER — Other Ambulatory Visit: Payer: Self-pay | Admitting: Internal Medicine

## 2024-08-15 ENCOUNTER — Other Ambulatory Visit (HOSPITAL_COMMUNITY)
Admission: RE | Admit: 2024-08-15 | Discharge: 2024-08-15 | Disposition: A | Discharge status: Home / Self Care | Source: Ambulatory Visit | Attending: Obstetrics and Gynecology | Admitting: Obstetrics and Gynecology

## 2024-08-15 VITALS — BP 157/92 | HR 73 | Wt 153.7 lb

## 2024-08-15 DIAGNOSIS — Z1231 Encounter for screening mammogram for malignant neoplasm of breast: Secondary | ICD-10-CM

## 2024-08-15 DIAGNOSIS — N958 Other specified menopausal and perimenopausal disorders: Secondary | ICD-10-CM

## 2024-08-15 DIAGNOSIS — N898 Other specified noninflammatory disorders of vagina: Secondary | ICD-10-CM

## 2024-08-15 NOTE — Progress Notes (Signed)
" ° ° °  GYNECOLOGY VISIT  Patient name: Victoria Huang MRN [id number]  Date of birth: [birth year] Chief Complaint:   Gynecologic Exam   History:  Victoria Huang here for follow up. Has been using vaginal estrogen. Will use the dove soap at home Has seen improvement with the vaginal estrogen  - less urinary frequency observed. Not having any incontinence. Feeling of something uncomfortable inside the vagina has improved Itching and burning ensation on the inside and has a smell to it. Same kind of smell as previous.  Years ago had a small pill that was prescribed before, daily medication and it would go away. Would clear up any discharge or smell. Would only take the medication when having symptoms and then it would go away.    The following portions of the patient's history were reviewed and updated as appropriate: allergies, current medications, past family history, past medical history, past social history, past surgical history and problem list.   Health Maintenance:   Last pap No results found for: DIAGPAP, HPVHIGH, ADEQPAP  Health Maintenance  Topic Date Due   Hepatitis C Screening  Never done   DTaP/Tdap/Td vaccine (1 - Tdap) Never done   Zoster (Shingles) Vaccine (1 of 2) Never done   Pneumococcal Vaccine for age over 19 (1 of 1 - PCV) Never done   Osteoporosis screening with Bone Density Scan  Never done   Medicare Annual Wellness Visit  11/18/2023   Flu Shot  03/04/2024   COVID-19 Vaccine (4 - 2025-26 season) 04/04/2024   Meningitis B Vaccine  Aged Out   Breast Cancer Screening  Discontinued      Review of Systems:  Pertinent items are noted in HPI. Comprehensive review of systems was otherwise negative.   Objective:  Physical Exam BP (!) 157/92   Pulse 73   Wt 153 lb 11.2 oz (69.7 kg)   BMI 27.23 kg/m    Physical Exam Vitals and nursing note reviewed. Exam conducted with a chaperone present.  Constitutional:      Appearance: Normal appearance.  HENT:      Head: Normocephalic and atraumatic.  Pulmonary:     Effort: Pulmonary effort is normal.     Breath sounds: Normal breath sounds.  Genitourinary:    General: Normal vulva.     Exam position: Lithotomy position.     Vagina: Normal.     Cervix: Normal.     Comments: No odor noted Skin:    General: Skin is warm and dry.  Neurological:     General: No focal deficit present.     Mental Status: She is alert.  Psychiatric:        Mood and Affect: Mood normal.        Behavior: Behavior normal.        Thought Content: Thought content normal.        Judgment: Judgment normal.        Assessment & Plan:   1. Genitourinary syndrome of menopause (Primary) Continue use of vaginal estrogen for continued improvement. Use at maintenance interval of 2-3x/weekly   2. Vaginal discharge 3. Vaginal irritation No odor appreciated on examination, vaginitis swab collected to assess for vaginitis. Increased risk of vaginitis in the setting of GSM.  - Cervicovaginal ancillary only    Carter Quarry, MD Minimally Invasive Gynecologic Surgery Center for [facility], [facility] "

## 2024-08-15 NOTE — Patient Instructions (Addendum)
 Avoid using hand soap to clean the vagina and vulva  Can use coconut oil in the morning. Pea sized amount at a time and can apply as often as needed  When washing, ok to use just water and then pat dry afterwards  Continue to use vaginal estrogen two to three nights a week  We will contact you with results of the vaginal swab

## 2024-08-16 LAB — CERVICOVAGINAL ANCILLARY ONLY
Bacterial Vaginitis (gardnerella): NEGATIVE
Candida Glabrata: POSITIVE — AB
Candida Vaginitis: NEGATIVE
Comment: NEGATIVE
Comment: NEGATIVE
Comment: NEGATIVE

## 2024-08-17 ENCOUNTER — Ambulatory Visit: Payer: Self-pay | Admitting: Obstetrics and Gynecology

## 2024-08-18 MED ORDER — BORIC ACID 600 MG VA SUPP: 1.0000 | Freq: Every day | VAGINAL | 1 refills | Status: AC

## 2024-08-18 NOTE — Telephone Encounter (Addendum)
-----   Message from Carter Quarry, MD sent at 08/17/2024 12:56 PM EST ----- Notify that swab shows candida glabrata, a type of yeast infection. This may explain symptoms, recommend vaginal boric acid nightly for 2 weeks. If not able to tolerate  1/15  1033  Called pt and informed her of test result as well as Rx sent to pharmacy. Dose instructions given. Pt was advised that she may purchase medication OTC if not covered by insurance. She voiced understanding.

## 2025-04-18 ENCOUNTER — Ambulatory Visit
